# Patient Record
Sex: Female | Born: 1965 | Race: White | Hispanic: No | Marital: Married | Smoking: Never smoker
Health system: Southern US, Community
[De-identification: ages and names within clinical notes are randomized; demographics above are authoritative.]

## PROBLEM LIST (undated history)

## (undated) DIAGNOSIS — J45909 Unspecified asthma, uncomplicated: Secondary | ICD-10-CM

## (undated) DIAGNOSIS — R42 Dizziness and giddiness: Secondary | ICD-10-CM

## (undated) DIAGNOSIS — R9431 Abnormal electrocardiogram [ECG] [EKG]: Secondary | ICD-10-CM

## (undated) DIAGNOSIS — E785 Hyperlipidemia, unspecified: Secondary | ICD-10-CM

## (undated) DIAGNOSIS — F430 Acute stress reaction: Secondary | ICD-10-CM

## (undated) DIAGNOSIS — R079 Chest pain, unspecified: Secondary | ICD-10-CM

## (undated) DIAGNOSIS — H918X1 Other specified hearing loss, right ear: Secondary | ICD-10-CM

## (undated) DIAGNOSIS — F419 Anxiety disorder, unspecified: Secondary | ICD-10-CM

## (undated) HISTORY — DX: Acute stress reaction: F43.0

## (undated) HISTORY — PX: CHOLECYSTECTOMY: SHX55

## (undated) HISTORY — PX: TONSILLECTOMY: SUR1361

## (undated) HISTORY — PX: EYE SURGERY: SHX253

## (undated) HISTORY — DX: Abnormal electrocardiogram (ECG) (EKG): R94.31

## (undated) HISTORY — DX: Other specified hearing loss, right ear: H91.8X1

## (undated) HISTORY — DX: Anxiety disorder, unspecified: F41.9

## (undated) HISTORY — DX: Hyperlipidemia, unspecified: E78.5

## (undated) HISTORY — PX: ADENOIDECTOMY: SUR15

## (undated) HISTORY — PX: FOOT SURGERY: SHX648

## (undated) HISTORY — DX: Chest pain, unspecified: R07.9

---

## 1987-08-09 HISTORY — PX: APPENDECTOMY: SHX54

## 1992-08-08 HISTORY — PX: KNEE SURGERY: SHX244

## 2007-07-31 ENCOUNTER — Other Ambulatory Visit: Admission: RE | Admit: 2007-07-31 | Discharge: 2007-07-31 | Payer: Self-pay | Admitting: Obstetrics and Gynecology

## 2018-07-09 ENCOUNTER — Ambulatory Visit: Payer: BLUE CROSS/BLUE SHIELD | Admitting: Podiatry

## 2018-07-09 ENCOUNTER — Ambulatory Visit (INDEPENDENT_AMBULATORY_CARE_PROVIDER_SITE_OTHER): Payer: BLUE CROSS/BLUE SHIELD

## 2018-07-09 ENCOUNTER — Encounter: Payer: Self-pay | Admitting: Podiatry

## 2018-07-09 VITALS — Resp 16 | Ht 65.0 in | Wt 165.0 lb

## 2018-07-09 DIAGNOSIS — M7731 Calcaneal spur, right foot: Secondary | ICD-10-CM

## 2018-07-09 DIAGNOSIS — M7661 Achilles tendinitis, right leg: Principal | ICD-10-CM

## 2018-07-09 DIAGNOSIS — M79672 Pain in left foot: Secondary | ICD-10-CM

## 2018-07-09 DIAGNOSIS — M7662 Achilles tendinitis, left leg: Secondary | ICD-10-CM

## 2018-07-09 DIAGNOSIS — M79671 Pain in right foot: Secondary | ICD-10-CM

## 2018-07-09 DIAGNOSIS — M7752 Other enthesopathy of left foot: Secondary | ICD-10-CM

## 2018-07-09 DIAGNOSIS — M7732 Calcaneal spur, left foot: Secondary | ICD-10-CM

## 2018-07-09 MED ORDER — MELOXICAM 15 MG PO TABS: 15.0000 mg | ORAL_TABLET | Freq: Every day | ORAL | 0 refills | Status: DC

## 2018-07-09 MED ORDER — METHYLPREDNISOLONE 4 MG PO TBPK: ORAL_TABLET | ORAL | 0 refills | Status: DC

## 2018-07-09 NOTE — Patient Instructions (Signed)
Achilles Tendinitis  with Rehab Achilles tendinitis is a disorder of the Achilles tendon. The Achilles tendon connects the large calf muscles (Gastrocnemius and Soleus) to the heel bone (calcaneus). This tendon is sometimes called the heel cord. It is important for pushing-off and standing on your toes and is important for walking, running, or jumping. Tendinitis is often caused by overuse and repetitive microtrauma. SYMPTOMS  Pain, tenderness, swelling, warmth, and redness may occur over the Achilles tendon even at rest.  Pain with pushing off, or flexing or extending the ankle.  Pain that is worsened after or during activity. CAUSES   Overuse sometimes seen with rapid increase in exercise programs or in sports requiring running and jumping.  Poor physical conditioning (strength and flexibility or endurance).  Running sports, especially training running down hills.  Inadequate warm-up before practice or play or failure to stretch before participation.  Injury to the tendon. PREVENTION   Warm up and stretch before practice or competition.  Allow time for adequate rest and recovery between practices and competition.  Keep up conditioning.  Keep up ankle and leg flexibility.  Improve or keep muscle strength and endurance.  Improve cardiovascular fitness.  Use proper technique.  Use proper equipment (shoes, skates).  To help prevent recurrence, taping, protective strapping, or an adhesive bandage may be recommended for several weeks after healing is complete. PROGNOSIS   Recovery may take weeks to several months to heal.  Longer recovery is expected if symptoms have been prolonged.  Recovery is usually quicker if the inflammation is due to a direct blow as compared with overuse or sudden strain. RELATED COMPLICATIONS   Healing time will be prolonged if the condition is not correctly treated. The injury must be given plenty of time to heal.  Symptoms can reoccur if  activity is resumed too soon.  Untreated, tendinitis may increase the risk of tendon rupture requiring additional time for recovery and possibly surgery. TREATMENT   The first treatment consists of rest anti-inflammatory medication, and ice to relieve the pain.  Stretching and strengthening exercises after resolution of pain will likely help reduce the risk of recurrence. Referral to a physical therapist or athletic trainer for further evaluation and treatment may be helpful.  A walking boot or cast may be recommended to rest the Achilles tendon. This can help break the cycle of inflammation and microtrauma.  Arch supports (orthotics) may be prescribed or recommended by your caregiver as an adjunct to therapy and rest.  Surgery to remove the inflamed tendon lining or degenerated tendon tissue is rarely necessary and has shown less than predictable results. MEDICATION   Nonsteroidal anti-inflammatory medications, such as aspirin and ibuprofen, may be used for pain and inflammation relief. Do not take within 7 days before surgery. Take these as directed by your caregiver. Contact your caregiver immediately if any bleeding, stomach upset, or signs of allergic reaction occur. Other minor pain relievers, such as acetaminophen, may also be used.  Pain relievers may be prescribed as necessary by your caregiver. Do not take prescription pain medication for longer than 4 to 7 days. Use only as directed and only as much as you need. HEAT AND COLD  Cold is used to relieve pain and reduce inflammation for acute and chronic Achilles tendinitis. Cold should be applied for 10 to 15 minutes every 2 to 3 hours for inflammation and pain and immediately after any activity that aggravates your symptoms. Use ice packs or an ice massage.  Heat may be used   before performing stretching and strengthening activities prescribed by your caregiver. Use a heat pack or a warm soak. SEEK MEDICAL CARE IF:  Symptoms get  worse or do not improve in 2 weeks despite treatment.  New, unexplained symptoms develop. Drugs used in treatment may produce side effects.   EXERCISES-- hold each stretch for 30 seconds and repeat 10 times.  Complete each stretch 3 times per day.   RANGE OF MOTION (ROM) AND STRETCHING EXERCISES - Achilles Tendinitis  These exercises may help you when beginning to rehabilitate your injury. Your symptoms may resolve with or without further involvement from your physician, physical therapist or athletic trainer. While completing these exercises, remember:   Restoring tissue flexibility helps normal motion to return to the joints. This allows healthier, less painful movement and activity.  An effective stretch should be held for at least 30 seconds.  A stretch should never be painful. You should only feel a gentle lengthening or release in the stretched tissue. STRETCH  Gastroc, Standing   Place hands on wall.  Extend right / left leg, keeping the front knee somewhat bent.  Slightly point your toes inward on your back foot.  Keeping your right / left heel on the floor and your knee straight, shift your weight toward the wall, not allowing your back to arch.  You should feel a gentle stretch in the right / left calf. Hold this position for __________ seconds. Repeat __________ times. Complete this stretch __________ times per day. STRETCH  Soleus, Standing   Place hands on wall.  Extend right / left leg, keeping the other knee somewhat bent.  Slightly point your toes inward on your back foot.  Keep your right / left heel on the floor, bend your back knee, and slightly shift your weight over the back leg so that you feel a gentle stretch deep in your back calf.  Hold this position for __________ seconds. Repeat __________ times. Complete this stretch __________ times per day. STRETCH  Gastrocsoleus, Standing  Note: This exercise can place a lot of stress on your foot and ankle.  Please complete this exercise only if specifically instructed by your caregiver.   Place the ball of your right / left foot on a step, keeping your other foot firmly on the same step.  Hold on to the wall or a rail for balance.  Slowly lift your other foot, allowing your body weight to press your heel down over the edge of the step.  You should feel a stretch in your right / left calf.  Hold this position for __________ seconds.  Repeat this exercise with a slight bend in your knee. Repeat __________ times. Complete this stretch __________ times per day.  STRENGTHENING EXERCISES - Achilles Tendinitis These exercises may help you when beginning to rehabilitate your injury. They may resolve your symptoms with or without further involvement from your physician, physical therapist or athletic trainer. While completing these exercises, remember:   Muscles can gain both the endurance and the strength needed for everyday activities through controlled exercises.  Complete these exercises as instructed by your physician, physical therapist or athletic trainer. Progress the resistance and repetitions only as guided.  You may experience muscle soreness or fatigue, but the pain or discomfort you are trying to eliminate should never worsen during these exercises. If this pain does worsen, stop and make certain you are following the directions exactly. If the pain is still present after adjustments, discontinue the exercise until you can discuss   the trouble with your clinician. STRENGTH - Plantar-flexors   Sit with your right / left leg extended. Holding onto both ends of a rubber exercise band/tubing, loop it around the ball of your foot. Keep a slight tension in the band.  Slowly push your toes away from you, pointing them downward.  Hold this position for __________ seconds. Return slowly, controlling the tension in the band/tubing. Repeat __________ times. Complete this exercise __________ times  per day.  STRENGTH - Plantar-flexors   Stand with your feet shoulder width apart. Steady yourself with a wall or table using as little support as needed.  Keeping your weight evenly spread over the width of your feet, rise up on your toes.*  Hold this position for __________ seconds. Repeat __________ times. Complete this exercise __________ times per day.  *If this is too easy, shift your weight toward your right / left leg until you feel challenged. Ultimately, you may be asked to do this exercise with your right / left foot only. STRENGTH  Plantar-flexors, Eccentric  Note: This exercise can place a lot of stress on your foot and ankle. Please complete this exercise only if specifically instructed by your caregiver.   Place the balls of your feet on a step. With your hands, use only enough support from a wall or rail to keep your balance.  Keep your knees straight and rise up on your toes.  Slowly shift your weight entirely to your right / left toes and pick up your opposite foot. Gently and with controlled movement, lower your weight through your right / left foot so that your heel drops below the level of the step. You will feel a slight stretch in the back of your calf at the end position.  Use the healthy leg to help rise up onto the balls of both feet, then lower weight only on the right / left leg again. Build up to 15 repetitions. Then progress to 3 consecutive sets of 15 repetitions.*  After completing the above exercise, complete the same exercise with a slight knee bend (about 30 degrees). Again, build up to 15 repetitions. Then progress to 3 consecutive sets of 15 repetitions.* Perform this exercise __________ times per day.  *When you easily complete 3 sets of 15, your physician, physical therapist or athletic trainer may advise you to add resistance by wearing a backpack filled with additional weight. STRENGTH - Plantar Flexors, Seated   Sit on a chair that allows your feet  to rest flat on the ground. If necessary, sit at the edge of the chair.  Keeping your toes firmly on the ground, lift your right / left heel as far as you can without increasing any discomfort in your ankle. Repeat __________ times. Complete this exercise __________ times a day. *If instructed by your physician, physical therapist or athletic trainer, you may add ____________________ of resistance by placing a weighted object on your right / left knee. Document Released: 02/23/2005 Document Revised: 10/17/2011 Document Reviewed: 11/06/2008 [employer] Patient Information 2014 [employer].    

## 2018-07-09 NOTE — Progress Notes (Signed)
  Subjective:  Patient ID: Brenda Lewis, female    DOB: 03-Jul-1966,  MRN: [id number]  Chief Complaint  Patient presents with  . Foot Pain    BL back heel pain x several wks; 8/10 pain pt states," worst in AM or from sitting to standing. Also, I sprain my ankle all the time due to my ankles turning ." Tx: naproxen and icing   52 y.o. female presents with the above complaint. Pain to back of both ankles as above. Also complains of pain to the left great toe states she dropped something on it in August but still hurting.  Review of Systems: Negative except as noted in the HPI. Denies N/V/F/Ch.  No past medical history on file.  Current Outpatient Medications:  .  meloxicam (MOBIC) 15 MG tablet, Take 1 tablet (15 mg total) by mouth daily., Disp: 30 tablet, Rfl: 0 .  methylPREDNISolone (MEDROL DOSEPAK) 4 MG TBPK tablet, 6 Day Taper Pack. Take as Directed., Disp: 21 tablet, Rfl: 0  Social History   Tobacco Use  Smoking Status Never Smoker  Smokeless Tobacco Never Used    Not on File Objective:   Vitals:   07/09/18 1546  Resp: 16   Body mass index is 27.46 kg/m. Constitutional Well developed. Well nourished.  Vascular Dorsalis pedis pulses palpable bilaterally. Posterior tibial pulses palpable bilaterally. Capillary refill normal to all digits.  No cyanosis or clubbing noted. Pedal hair growth normal.  Neurologic Normal speech. Oriented to person, place, and time. Epicritic sensation to light touch grossly present bilaterally.  Dermatologic Nails well groomed and normal in appearance. No open wounds. No skin lesions.  Orthopedic: Normal joint ROM without pain or crepitus bilaterally. No visible deformities. Haglund deformity bilat POP posterior heel bilat POP left dorsal 1st MPJ.   Radiographs: Taken and reviewed posterior and plantar calc spurring with likely intratendinous bodies. Assessment:   1. Achilles tendonitis, bilateral   2. Heel spur, left   3. Heel spur,  right   4. Pain of left heel   5. Pain of right heel   6. Capsulitis of metatarsophalangeal (MTP) joint of left foot    Plan:  Patient was evaluated and treated and all questions answered.  Achilles Tendonitis, bilaterally -XR reviewed as above. -Educated on stretching and icing of the affected limb. -Night splint dispensed x2 -Referral placed to physical therapy. -eRx for Medrol 6-day taper. Advised on how to take medication. -eRx for Meloxicam. Advised only to take upon completion of oral steroid taper.  Capsulitis Left 1st MPJ -Offered injection, declined.  Return in about 4 weeks (around 08/06/2018).

## 2018-07-11 ENCOUNTER — Other Ambulatory Visit: Payer: Self-pay | Admitting: Podiatry

## 2018-07-11 DIAGNOSIS — M7661 Achilles tendinitis, right leg: Principal | ICD-10-CM

## 2018-07-11 DIAGNOSIS — M7752 Other enthesopathy of left foot: Secondary | ICD-10-CM

## 2018-07-11 DIAGNOSIS — M7731 Calcaneal spur, right foot: Secondary | ICD-10-CM

## 2018-07-11 DIAGNOSIS — M7732 Calcaneal spur, left foot: Secondary | ICD-10-CM

## 2018-07-11 DIAGNOSIS — M7662 Achilles tendinitis, left leg: Principal | ICD-10-CM

## 2018-07-12 ENCOUNTER — Ambulatory Visit: Payer: BLUE CROSS/BLUE SHIELD | Admitting: Podiatry

## 2018-08-06 ENCOUNTER — Ambulatory Visit: Payer: BLUE CROSS/BLUE SHIELD | Admitting: Podiatry

## 2018-08-27 ENCOUNTER — Ambulatory Visit (INDEPENDENT_AMBULATORY_CARE_PROVIDER_SITE_OTHER): Payer: BLUE CROSS/BLUE SHIELD | Admitting: Podiatry

## 2018-08-27 ENCOUNTER — Other Ambulatory Visit: Payer: Self-pay

## 2018-08-27 DIAGNOSIS — Z5329 Procedure and treatment not carried out because of patient's decision for other reasons: Secondary | ICD-10-CM

## 2018-12-18 ENCOUNTER — Other Ambulatory Visit: Payer: Self-pay

## 2018-12-18 ENCOUNTER — Ambulatory Visit: Payer: BLUE CROSS/BLUE SHIELD | Admitting: Podiatry

## 2018-12-18 ENCOUNTER — Encounter: Payer: Self-pay | Admitting: Podiatry

## 2018-12-18 VITALS — Temp 98.0°F | Resp 16

## 2018-12-18 DIAGNOSIS — M7731 Calcaneal spur, right foot: Secondary | ICD-10-CM

## 2018-12-18 DIAGNOSIS — M79671 Pain in right foot: Secondary | ICD-10-CM

## 2018-12-18 DIAGNOSIS — M7661 Achilles tendinitis, right leg: Secondary | ICD-10-CM

## 2018-12-18 DIAGNOSIS — M7662 Achilles tendinitis, left leg: Secondary | ICD-10-CM

## 2018-12-18 DIAGNOSIS — M9261 Juvenile osteochondrosis of tarsus, right ankle: Secondary | ICD-10-CM | POA: Diagnosis not present

## 2018-12-18 NOTE — Patient Instructions (Signed)
Pre-Operative Instructions  Congratulations, you have decided to take an important step towards improving your quality of life.  You can be assured that the doctors and staff at [facility] will be with you every step of the way.  Here are some important things you should know:  1. Plan to be at the surgery center/hospital at least 1 (one) hour prior to your scheduled time, unless otherwise directed by the surgical center/hospital staff.  You must have a responsible adult accompany you, remain during the surgery and drive you home.  Make sure you have directions to the surgical center/hospital to ensure you arrive on time. 2. If you are having surgery at [facility] or [facility] hospitals, you will need a copy of your medical history and physical form from your family physician within one month prior to the date of surgery. We will give you a form for your primary physician to complete.  3. We make every effort to accommodate the date you request for surgery.  However, there are times where surgery dates or times have to be moved.  We will contact you as soon as possible if a change in schedule is required.   4. No aspirin/ibuprofen for one week before surgery.  If you are on aspirin, any non-steroidal anti-inflammatory medications (Mobic, Aleve, Ibuprofen) should not be taken seven (7) days prior to your surgery.  You make take Tylenol for pain prior to surgery.  5. Medications - If you are taking daily heart and blood pressure medications, seizure, reflux, allergy, asthma, anxiety, pain or diabetes medications, make sure you notify the surgery center/hospital before the day of surgery so they can tell you which medications you should take or avoid the day of surgery. 6. No food or drink after midnight the night before surgery unless directed otherwise by surgical center/hospital staff. 7. No alcoholic beverages 24-hours prior to surgery.  No smoking 24-hours prior or 24-hours after  surgery. 8. Wear loose pants or shorts. They should be loose enough to fit over bandages, boots, and casts. 9. Don't wear slip-on shoes. Sneakers are preferred. 10. Bring your boot with you to the surgery center/hospital.  Also bring crutches or a walker if your physician has prescribed it for you.  If you do not have this equipment, it will be provided for you after surgery. 11. If you have not been contacted by the surgery center/hospital by the day before your surgery, call to confirm the date and time of your surgery. 12. Leave-time from work may vary depending on the type of surgery you have.  Appropriate arrangements should be made prior to surgery with your employer. 13. Prescriptions will be provided immediately following surgery by your doctor.  Fill these as soon as possible after surgery and take the medication as directed. Pain medications will not be refilled on weekends and must be approved by the doctor. 14. Remove nail polish on the operative foot and avoid getting pedicures prior to surgery. 15. Wash the night before surgery.  The night before surgery wash the foot and leg well with water and the antibacterial soap provided. Be sure to pay special attention to beneath the toenails and in between the toes.  Wash for at least three (3) minutes. Rinse thoroughly with water and dry well with a towel.  Perform this wash unless told not to do so by your physician.  Enclosed: 1 Ice pack (please put in freezer the night before surgery)   1 Hibiclens skin cleaner     Pre-op instructions  If you have any questions regarding the instructions, please do not hesitate to call our office.  [place]: [address], [place], [place] [address] -- [phone]  [place]: [address] [place], [place] [address] -- [phone]  [place]: [address]  [place], [place] [address] -- [phone]  [place]: [address], [address], [place], [place] [address] -- [phone]  Website:  [email or web address] 

## 2018-12-19 ENCOUNTER — Telehealth: Payer: Self-pay | Admitting: *Deleted

## 2018-12-19 NOTE — Telephone Encounter (Addendum)
"  I was told to call you to schedule my surgery."  Do you have a date that you would like to have it done?  "Yes, I'd like to do it the Wednesday before Fourth of July, July 1."  I'll get it scheduled.  "What time?" Someone from the surgical center will give you a call a day or two prior to your surgery date and that person will give you your arrival time.  You need to go online and register with the surgical via their portal.

## 2019-01-02 DIAGNOSIS — M79642 Pain in left hand: Secondary | ICD-10-CM

## 2019-01-02 DIAGNOSIS — M6580 Other synovitis and tenosynovitis, unspecified site: Secondary | ICD-10-CM

## 2019-01-02 DIAGNOSIS — M659 Synovitis and tenosynovitis, unspecified: Secondary | ICD-10-CM

## 2019-01-02 DIAGNOSIS — G5603 Carpal tunnel syndrome, bilateral upper limbs: Secondary | ICD-10-CM

## 2019-01-02 DIAGNOSIS — M25531 Pain in right wrist: Secondary | ICD-10-CM

## 2019-01-02 DIAGNOSIS — M720 Palmar fascial fibromatosis [Dupuytren]: Secondary | ICD-10-CM | POA: Insufficient documentation

## 2019-01-02 HISTORY — DX: Pain in left hand: M79.642

## 2019-01-02 HISTORY — DX: Pain in right wrist: M25.531

## 2019-01-02 HISTORY — DX: Other synovitis and tenosynovitis, unspecified site: M65.80

## 2019-01-02 HISTORY — DX: Palmar fascial fibromatosis (dupuytren): M72.0

## 2019-01-02 HISTORY — DX: Synovitis and tenosynovitis, unspecified: M65.9

## 2019-01-02 HISTORY — DX: Carpal tunnel syndrome, bilateral upper limbs: G56.03

## 2019-01-05 NOTE — Progress Notes (Signed)
  Subjective:  Patient ID: Brenda Lewis, female    DOB: [birth year],  MRN: [id number]  Chief Complaint  Patient presents with  . Tendonitis    F/U BL tendonitis Pt.s tates," foot pain is the same or worse. Pain depeds how much I do;  8-9/10 sharp pain. (L>R) Tx: PT, NS, stretching, icing and elevation -pt deneis N/V?F/Ch -pt states she completed Medrol and Meloxcam and did not help much also NS was too mch pressure on her heel   53 y.o. female presents with the above complaint. Hx above confirmed with patient.  No past medical history on file.  Current Outpatient Medications:  .  benzonatate (TESSALON) 200 MG capsule, , Disp: , Rfl:  .  estradiol (ESTRACE) 0.5 MG tablet, , Disp: , Rfl:  .  progesterone (PROMETRIUM) 200 MG capsule, , Disp: , Rfl:   Social History   Tobacco Use  Smoking Status Never Smoker  Smokeless Tobacco Never Used    Allergies  Allergen Reactions  . Contrast Media [Iodinated Diagnostic Agents]   . Codeine Other (See Comments)   Objective:   Vitals:   12/18/18 1629  Resp: 16  Temp: 98 F (36.7 C)   There is no height or weight on file to calculate BMI. Constitutional Well developed. Well nourished.  Vascular Dorsalis pedis pulses palpable bilaterally. Posterior tibial pulses palpable bilaterally. Capillary refill normal to all digits.  No cyanosis or clubbing noted. Pedal hair growth normal.  Neurologic Normal speech. Oriented to person, place, and time. Epicritic sensation to light touch grossly present bilaterally.  Dermatologic Nails well groomed and normal in appearance. No open wounds. No skin lesions.  Orthopedic: Normal joint ROM without pain or crepitus bilaterally. No visible deformities. Haglund deformity bilat POP posterior heel bilat POP left dorsal 1st MPJ.   Radiographs: None Assessment:   1. Achilles tendonitis, bilateral   2. Heel spur, right   3. Pain of right heel   4. Haglund's deformity of right heel    Plan:   Patient was evaluated and treated and all questions answered.  Achilles Tendonitis, bilaterally -Patient has failed all conservative therapy and wishes to proceed with surgical intervention. All risks, benefits, and alternatives discussed with patient. No guarantees given. Consent reviewed and signed by patient. -Planned procedures: Gastrocnemius recession right, calcaneal ostectomy with detachment and reattachment of Achilles tendon. -Patient verbalized understanding that she will be nonweightbearing post procedure. -We will discussed with patient whether we need to prophylax for DVT  27 minutes of face to face time were spent with the patient. >50% of this was spent on counseling and coordination of care. Specifically discussed with patient the above diagnoses and overall treatment plan.   No follow-ups on file.

## 2019-01-29 ENCOUNTER — Other Ambulatory Visit: Payer: Self-pay | Admitting: *Deleted

## 2019-01-29 DIAGNOSIS — M7662 Achilles tendinitis, left leg: Secondary | ICD-10-CM

## 2019-01-29 DIAGNOSIS — M79671 Pain in right foot: Secondary | ICD-10-CM

## 2019-01-29 DIAGNOSIS — M7661 Achilles tendinitis, right leg: Secondary | ICD-10-CM

## 2019-01-29 DIAGNOSIS — M7731 Calcaneal spur, right foot: Secondary | ICD-10-CM

## 2019-01-29 DIAGNOSIS — M9261 Juvenile osteochondrosis of tarsus, right ankle: Secondary | ICD-10-CM

## 2019-01-29 NOTE — Progress Notes (Signed)
DME order for knee scooter for postoperative use

## 2019-01-31 ENCOUNTER — Telehealth: Payer: Self-pay | Admitting: *Deleted

## 2019-01-31 NOTE — Telephone Encounter (Signed)
DOS 02/06/2019; 91694 - CALCANEAL OSTECTOMY AND 50388 - GASTROCNEMIUS RECESSION RIGHT FOOT  BCBS: Effective Date - 07/11/2016 - 08/07/9998   In-Network    Max Per Benefit Period Year-to-Date Remaining  CoInsurance  $1,500.00 $896.70  Deductible  $300.00 $155.64  Out-Of-Pocket  $1,500.00 $896.70   In-Network Individual  [employer] Coinsurance Authorization Required  Not Applicable  82%  Yes   PRE-CERTIFICATION IS NOT REQUIRED PER [employer] - Call Ref. # [id number]

## 2019-02-06 ENCOUNTER — Encounter: Payer: Self-pay | Admitting: Podiatry

## 2019-02-06 ENCOUNTER — Other Ambulatory Visit: Payer: Self-pay | Admitting: Podiatry

## 2019-02-06 DIAGNOSIS — M7661 Achilles tendinitis, right leg: Secondary | ICD-10-CM

## 2019-02-06 DIAGNOSIS — M216X1 Other acquired deformities of right foot: Secondary | ICD-10-CM

## 2019-02-06 DIAGNOSIS — M2021 Hallux rigidus, right foot: Secondary | ICD-10-CM

## 2019-02-06 MED ORDER — OXYCODONE-ACETAMINOPHEN 10-325 MG PO TABS: 1.0000 | ORAL_TABLET | ORAL | 0 refills | Status: DC | PRN

## 2019-02-06 MED ORDER — CEPHALEXIN 500 MG PO CAPS: 500.0000 mg | ORAL_CAPSULE | Freq: Two times a day (BID) | ORAL | 0 refills | Status: DC

## 2019-02-06 NOTE — Progress Notes (Signed)
Rx sent to pharmacy for outpatient surgery. °

## 2019-02-07 ENCOUNTER — Telehealth: Payer: Self-pay

## 2019-02-07 NOTE — Telephone Encounter (Signed)
POST OP CALL-    1) General condition stated by the patient: Pt doing well  2) Is the pt having pain? None, block still working.  3) Pain score: None, but has some swelling.  4) Has the pt taken Rx'd pain medication, regularly or PRN? Not yet, still numb  5) Is the pain medication giving relief?  6) Any fever, chills, nausea, or vomiting, shortness of breath or tightness in calf? None  7) Is the bandage clean, dry and intact? Yes  8) Is there excessive tightness, bleeding or drainage coming through the bandage? No  9) Did you understand all of the post op instruction sheet given?  10) Any questions or concerns regarding post op care/recovery? Pt asked if Benadryl was okay to take with Oxycodone. Pt was informed that she can do this.    Confirmed POV appointment with patient

## 2019-02-12 ENCOUNTER — Other Ambulatory Visit: Payer: Self-pay

## 2019-02-12 ENCOUNTER — Ambulatory Visit (INDEPENDENT_AMBULATORY_CARE_PROVIDER_SITE_OTHER): Payer: BC Managed Care – PPO | Admitting: Podiatry

## 2019-02-12 ENCOUNTER — Encounter: Payer: Self-pay | Admitting: Podiatry

## 2019-02-12 ENCOUNTER — Other Ambulatory Visit: Payer: Self-pay | Admitting: Podiatry

## 2019-02-12 ENCOUNTER — Ambulatory Visit (INDEPENDENT_AMBULATORY_CARE_PROVIDER_SITE_OTHER): Payer: BC Managed Care – PPO

## 2019-02-12 VITALS — Temp 96.1°F | Resp 16

## 2019-02-12 DIAGNOSIS — M9261 Juvenile osteochondrosis of tarsus, right ankle: Secondary | ICD-10-CM | POA: Diagnosis not present

## 2019-02-12 DIAGNOSIS — Z9889 Other specified postprocedural states: Secondary | ICD-10-CM

## 2019-02-12 DIAGNOSIS — M7661 Achilles tendinitis, right leg: Secondary | ICD-10-CM

## 2019-02-12 DIAGNOSIS — M779 Enthesopathy, unspecified: Secondary | ICD-10-CM

## 2019-02-12 DIAGNOSIS — M7662 Achilles tendinitis, left leg: Secondary | ICD-10-CM | POA: Diagnosis not present

## 2019-02-19 ENCOUNTER — Encounter: Payer: BLUE CROSS/BLUE SHIELD | Admitting: Podiatry

## 2019-02-20 ENCOUNTER — Telehealth: Payer: Self-pay | Admitting: *Deleted

## 2019-02-20 NOTE — Telephone Encounter (Signed)
Patient called stated that she fell off her knee scooter putting her weight on her casted surgery foot Tuesday afternoon around 4pm.  States she has been having more pain than before.  Spoke with Dr Cannon Kettle and went over areas of concern  Swelling was worse last night causing cast to be slightly tighter but has gone down since.  Pain is being relieved with current pain meds.  No redness, warmth or streaking where visible around cast.  Per Dr Cannon Kettle patient is to rest with elevation as much as possible staying on top of pain meds and icing.  If swelling increases to where cast is tight and or painful she notices redness warmth or streaking she is to call the office to schedule an appointment or proceed to the ER if after hours.  Patient stated understanding.

## 2019-02-20 NOTE — Telephone Encounter (Signed)
Noted thank

## 2019-02-25 ENCOUNTER — Other Ambulatory Visit: Payer: Self-pay | Admitting: Podiatry

## 2019-02-25 DIAGNOSIS — Z9889 Other specified postprocedural states: Secondary | ICD-10-CM

## 2019-02-25 DIAGNOSIS — M779 Enthesopathy, unspecified: Secondary | ICD-10-CM

## 2019-02-26 ENCOUNTER — Ambulatory Visit (INDEPENDENT_AMBULATORY_CARE_PROVIDER_SITE_OTHER): Payer: Self-pay | Admitting: Sports Medicine

## 2019-02-26 ENCOUNTER — Other Ambulatory Visit: Payer: Self-pay

## 2019-02-26 ENCOUNTER — Encounter: Payer: Self-pay | Admitting: Sports Medicine

## 2019-02-26 ENCOUNTER — Encounter: Payer: BC Managed Care – PPO | Admitting: Podiatry

## 2019-02-26 VITALS — Temp 98.0°F

## 2019-02-26 DIAGNOSIS — M9261 Juvenile osteochondrosis of tarsus, right ankle: Secondary | ICD-10-CM

## 2019-02-26 DIAGNOSIS — M7731 Calcaneal spur, right foot: Secondary | ICD-10-CM

## 2019-02-26 DIAGNOSIS — M7661 Achilles tendinitis, right leg: Secondary | ICD-10-CM

## 2019-02-26 DIAGNOSIS — M79671 Pain in right foot: Secondary | ICD-10-CM

## 2019-02-26 DIAGNOSIS — M7662 Achilles tendinitis, left leg: Secondary | ICD-10-CM

## 2019-02-26 NOTE — Progress Notes (Signed)
Subjective: Brenda Lewis is a 53 y.o. female patient seen today in office for POV #1 (DOS 02-06-19), S/P Right achilles tendon lenthening, heel spur reduction with Dr. March Rummage . Patient admits some pain at the lateral edge of the cast all the time, denies calf pain, denies headache, chest pain, shortness of breath, nausea, vomiting, fever, or chills. No other issues noted.   There are no active problems to display for this patient.   Current Outpatient Medications on File Prior to Visit  Medication Sig Dispense Refill  . albuterol (VENTOLIN HFA) 108 (90 Base) MCG/ACT inhaler albuterol sulfate HFA 90 mcg/actuation aerosol inhaler    . ALPRAZolam (XANAX) 0.25 MG tablet TAKE 1 TAB(S) ORALLY 2 TIMES A DAY FOR 7 DAY(S) AS NEEDED    . benzonatate (TESSALON) 200 MG capsule     . cephALEXin (KEFLEX) 500 MG capsule Take 1 capsule (500 mg total) by mouth 2 (two) times daily. 14 capsule 0  . estradiol (ESTRACE) 0.5 MG tablet     . oxyCODONE-acetaminophen (PERCOCET) 10-325 MG tablet Take 1 tablet by mouth every 4 (four) hours as needed for pain. 20 tablet 0  . progesterone (PROMETRIUM) 200 MG capsule      No current facility-administered medications on file prior to visit.     Allergies  Allergen Reactions  . Gadolinium Derivatives Anaphylaxis and Shortness Of Breath  . Contrast Media [Iodinated Diagnostic Agents]   . Codeine Other (See Comments)    Objective: There were no vitals filed for this visit.  General: No acute distress, AAOx3  Right foot: Staples intact with minimal gapping or dehiscence at surgical site, mild swelling to right foot, no erythema, no warmth, no drainage, no signs of infection noted, Capillary fill time <3 seconds in all digits, gross sensation present via light touch to right foot.  No pain with calf compression.   Assessment and Plan:  Problem List Items Addressed This Visit    None    Visit Diagnoses    Haglund's deformity of right heel    -  Primary   Achilles  tendonitis, bilateral       Heel spur, right       Pain of right heel          -Patient seen and evaluated -Cast was removed and few staples were removed and dry dressing applied -Dispensed CAM boot (patient will bring her other boot that she has at home to return it to [place] since she has a boot already) -Advised patient to continue with NWB with use of scooter -Continue with rest, ice, elevation, and Motrin PRN to assist with pain control. Advised patient that tingling can be normal post op and that we will continue to monitor to see how it improves with time -Will plan for finishing staple removal at next office visit with Dr. March Rummage. In the meantime, patient to call office if any issues or problems arise.   Landis Martins, DPM

## 2019-03-02 DIAGNOSIS — M189 Osteoarthritis of first carpometacarpal joint, unspecified: Secondary | ICD-10-CM

## 2019-03-02 HISTORY — DX: Osteoarthritis of first carpometacarpal joint, unspecified: M18.9

## 2019-03-03 NOTE — Progress Notes (Signed)
  Subjective:  Patient ID: Brenda Lewis, female    DOB: 1966/05/16,  MRN: [id number]  Chief Complaint  Patient presents with  . Routine Post Op    POV#1 DOS 02/06/2019 CALCANEAL OSTECTOMY AND GASTROCNEMIUS RECESS RT -pt states," pain depends on what I do; 1-2/10 discomofrt and w/o IBU of oxy 4/10 discomfort." Tx: boot, IBU, oxy , scooter, elevation and eicing -pt denies N/V/?FH     DOS: 02/06/2019 Procedure: Gastrocnemius recession right, Haglund resection with detachment reattachment of Achilles tendon  53 y.o. female returns for post-op check.  History as above.  Review of Systems: Negative except as noted in the HPI. Denies N/V/F/Ch.  No past medical history on file.  Current Outpatient Medications:  .  albuterol (VENTOLIN HFA) 108 (90 Base) MCG/ACT inhaler, albuterol sulfate HFA 90 mcg/actuation aerosol inhaler, Disp: , Rfl:  .  ALPRAZolam (XANAX) 0.25 MG tablet, TAKE 1 TAB(S) ORALLY 2 TIMES A DAY FOR 7 DAY(S) AS NEEDED, Disp: , Rfl:  .  benzonatate (TESSALON) 200 MG capsule, , Disp: , Rfl:  .  cephALEXin (KEFLEX) 500 MG capsule, Take 1 capsule (500 mg total) by mouth 2 (two) times daily., Disp: 14 capsule, Rfl: 0 .  estradiol (ESTRACE) 0.5 MG tablet, , Disp: , Rfl:  .  oxyCODONE-acetaminophen (PERCOCET) 10-325 MG tablet, Take 1 tablet by mouth every 4 (four) hours as needed for pain., Disp: 20 tablet, Rfl: 0 .  progesterone (PROMETRIUM) 200 MG capsule, , Disp: , Rfl:   Social History   Tobacco Use  Smoking Status Never Smoker  Smokeless Tobacco Never Used    Allergies  Allergen Reactions  . Gadolinium Derivatives Anaphylaxis and Shortness Of Breath  . Contrast Media [Iodinated Diagnostic Agents]   . Codeine Other (See Comments)   Objective:   Vitals:   02/12/19 1059  Resp: 16  Temp: (!) 96.1 F (35.6 C)   There is no height or weight on file to calculate BMI. Constitutional Well developed. Well nourished.  Vascular Foot warm and well perfused. Capillary refill  normal to all digits.   Neurologic Normal speech. Oriented to person, place, and time. Epicritic sensation to light touch grossly present bilaterally.  Dermatologic Skin healing well without signs of infection. Skin edges well coapted without signs of infection.  Orthopedic: Tenderness to palpation noted about the surgical site.   Radiographs: None Assessment:   1. Haglund's deformity of right heel   2. Achilles tendonitis, bilateral   3. Post-operative state    Plan:  Patient was evaluated and treated and all questions answered.  S/p foot surgery right -Progressing as expected post-operatively. -XR: None -WB Status: Nonweightbearing in knee scooter -Sutures: Intact. -Medications: None refilled -Foot redressed.  Short leg cast applied with 3 rolls of fiberglass cast material  No follow-ups on file.

## 2019-03-05 ENCOUNTER — Ambulatory Visit (INDEPENDENT_AMBULATORY_CARE_PROVIDER_SITE_OTHER): Payer: Self-pay | Admitting: Podiatry

## 2019-03-05 ENCOUNTER — Other Ambulatory Visit: Payer: Self-pay

## 2019-03-05 ENCOUNTER — Encounter: Payer: Self-pay | Admitting: Podiatry

## 2019-03-05 VITALS — Temp 100.0°F | Resp 16

## 2019-03-05 DIAGNOSIS — M7731 Calcaneal spur, right foot: Secondary | ICD-10-CM

## 2019-03-05 DIAGNOSIS — M79671 Pain in right foot: Secondary | ICD-10-CM

## 2019-03-05 DIAGNOSIS — M9261 Juvenile osteochondrosis of tarsus, right ankle: Secondary | ICD-10-CM

## 2019-03-05 DIAGNOSIS — M7661 Achilles tendinitis, right leg: Secondary | ICD-10-CM

## 2019-03-05 DIAGNOSIS — M7662 Achilles tendinitis, left leg: Secondary | ICD-10-CM

## 2019-03-05 NOTE — Progress Notes (Signed)
  Subjective:  Patient ID: Brenda Lewis, female    DOB: Mar 08, 1966,  MRN: [id number]  Chief Complaint  Patient presents with  . Routine Post Op    Pt. states," yesterday los my blaqnce and fell but I didn't hit my foot. Overall, it's been doin good. Still with a numb place at my ankle." tx: boot, scooter, rest, icing, and elevation and IBu     DOS: 02/06/2019 Procedure: Gastrocnemius recession right, Haglund resection with detachment reattachment of Achilles tendon  53 y.o. female returns for post-op check.  History as above.  Review of Systems: Negative except as noted in the HPI. Denies N/V/F/Ch.  No past medical history on file.  Current Outpatient Medications:  .  albuterol (VENTOLIN HFA) 108 (90 Base) MCG/ACT inhaler, albuterol sulfate HFA 90 mcg/actuation aerosol inhaler, Disp: , Rfl:  .  ALPRAZolam (XANAX) 0.25 MG tablet, TAKE 1 TAB(S) ORALLY 2 TIMES A DAY FOR 7 DAY(S) AS NEEDED, Disp: , Rfl:  .  benzonatate (TESSALON) 200 MG capsule, , Disp: , Rfl:  .  cephALEXin (KEFLEX) 500 MG capsule, Take 1 capsule (500 mg total) by mouth 2 (two) times daily., Disp: 14 capsule, Rfl: 0 .  estradiol (ESTRACE) 0.5 MG tablet, , Disp: , Rfl:  .  oxyCODONE-acetaminophen (PERCOCET) 10-325 MG tablet, Take 1 tablet by mouth every 4 (four) hours as needed for pain., Disp: 20 tablet, Rfl: 0 .  progesterone (PROMETRIUM) 200 MG capsule, , Disp: , Rfl:   Social History   Tobacco Use  Smoking Status Never Smoker  Smokeless Tobacco Never Used    Allergies  Allergen Reactions  . Gadolinium Derivatives Anaphylaxis, Shortness Of Breath and Hives  . Contrast Media [Iodinated Diagnostic Agents]   . Codeine Other (See Comments)   Objective:   Vitals:   03/05/19 1630  Resp: 16  Temp: 100 F (37.8 C)   There is no height or weight on file to calculate BMI. Constitutional Well developed. Well nourished.  Vascular Foot warm and well perfused. Capillary refill normal to all digits.   Neurologic  Normal speech. Oriented to person, place, and time. Epicritic sensation to light touch grossly present bilaterally. Slight area of hypoesthesia lateral to the incision but sural nerve distribution intact.  Dermatologic Skin healing well without signs of infection. Skin edges well coapted without signs of infection.  Orthopedic: Tenderness to palpation noted about the surgical site.   Radiographs: None Assessment:   1. Haglund's deformity of right heel   2. Achilles tendonitis, bilateral   3. Heel spur, right   4. Pain of right heel    Plan:  Patient was evaluated and treated and all questions answered.  S/p foot surgery right -Progressing as expected post-operatively. -XR: None -WB Status: Nonweightbearing in knee scooter -Sutures: Staples left intact -Refer to PT. Start PROM x1 week. Then AROM x 1 week. No WB. Plan to transition WB in 2 weeks. -Medications: None refilled  No follow-ups on file. Will also fill out forms for to plan her left foot surgery at next visit.

## 2019-03-08 ENCOUNTER — Telehealth: Payer: Self-pay | Admitting: Podiatry

## 2019-03-08 NOTE — Telephone Encounter (Signed)
[facility] called stating the patient had reached out to them wanting to get scheduled for an appt but their office has not received a referral from [place].   Please fax referral to their office.  Pt is schedule for Tuesday, 8/4 @ 10am   Fax# [phone] Phone# [phone]

## 2019-03-19 ENCOUNTER — Ambulatory Visit (INDEPENDENT_AMBULATORY_CARE_PROVIDER_SITE_OTHER): Payer: BC Managed Care – PPO | Admitting: Podiatry

## 2019-03-19 ENCOUNTER — Other Ambulatory Visit: Payer: Self-pay

## 2019-03-19 ENCOUNTER — Telehealth: Payer: Self-pay | Admitting: *Deleted

## 2019-03-19 DIAGNOSIS — M21862 Other specified acquired deformities of left lower leg: Secondary | ICD-10-CM

## 2019-03-19 DIAGNOSIS — M9262 Juvenile osteochondrosis of tarsus, left ankle: Secondary | ICD-10-CM

## 2019-03-19 DIAGNOSIS — M7732 Calcaneal spur, left foot: Secondary | ICD-10-CM

## 2019-03-19 DIAGNOSIS — M79672 Pain in left foot: Secondary | ICD-10-CM

## 2019-03-19 DIAGNOSIS — M7752 Other enthesopathy of left foot: Secondary | ICD-10-CM

## 2019-03-19 NOTE — Patient Instructions (Signed)
Pre-Operative Instructions  Congratulations, you have decided to take an important step towards improving your quality of life.  You can be assured that the doctors and staff at [facility] will be with you every step of the way.  Here are some important things you should know:  1. Plan to be at the surgery center/hospital at least 1 (one) hour prior to your scheduled time, unless otherwise directed by the surgical center/hospital staff.  You must have a responsible adult accompany you, remain during the surgery and drive you home.  Make sure you have directions to the surgical center/hospital to ensure you arrive on time. 2. If you are having surgery at [facility] or [facility] hospitals, you will need a copy of your medical history and physical form from your family physician within one month prior to the date of surgery. We will give you a form for your primary physician to complete.  3. We make every effort to accommodate the date you request for surgery.  However, there are times where surgery dates or times have to be moved.  We will contact you as soon as possible if a change in schedule is required.   4. No aspirin/ibuprofen for one week before surgery.  If you are on aspirin, any non-steroidal anti-inflammatory medications (Mobic, Aleve, Ibuprofen) should not be taken seven (7) days prior to your surgery.  You make take Tylenol for pain prior to surgery.  5. Medications - If you are taking daily heart and blood pressure medications, seizure, reflux, allergy, asthma, anxiety, pain or diabetes medications, make sure you notify the surgery center/hospital before the day of surgery so they can tell you which medications you should take or avoid the day of surgery. 6. No food or drink after midnight the night before surgery unless directed otherwise by surgical center/hospital staff. 7. No alcoholic beverages 24-hours prior to surgery.  No smoking 24-hours prior or 24-hours after  surgery. 8. Wear loose pants or shorts. They should be loose enough to fit over bandages, boots, and casts. 9. Don't wear slip-on shoes. Sneakers are preferred. 10. Bring your boot with you to the surgery center/hospital.  Also bring crutches or a walker if your physician has prescribed it for you.  If you do not have this equipment, it will be provided for you after surgery. 11. If you have not been contacted by the surgery center/hospital by the day before your surgery, call to confirm the date and time of your surgery. 12. Leave-time from work may vary depending on the type of surgery you have.  Appropriate arrangements should be made prior to surgery with your employer. 13. Prescriptions will be provided immediately following surgery by your doctor.  Fill these as soon as possible after surgery and take the medication as directed. Pain medications will not be refilled on weekends and must be approved by the doctor. 14. Remove nail polish on the operative foot and avoid getting pedicures prior to surgery. 15. Wash the night before surgery.  The night before surgery wash the foot and leg well with water and the antibacterial soap provided. Be sure to pay special attention to beneath the toenails and in between the toes.  Wash for at least three (3) minutes. Rinse thoroughly with water and dry well with a towel.  Perform this wash unless told not to do so by your physician.  Enclosed: 1 Ice pack (please put in freezer the night before surgery)   1 Hibiclens skin cleaner     Pre-op instructions  If you have any questions regarding the instructions, please do not hesitate to call our office.  [place]: [address], [place], [place] [address] -- [phone]  [place]: [address] [place], [place] [address] -- [phone]  [place]: [address]  , [place] [address] -- [phone]  [place]: [address], [address], [place],  [address] -- [phone]  Website:  [email or web address] 

## 2019-03-19 NOTE — Telephone Encounter (Signed)
"  I'm calling to schedule my surgery with Dr. Amalia Hailey."  Have you signed your consent forms?  "Yes, I signed them this morning."  I don't have your paperwork at this time.  I will call you back to schedule it when I get the needed information.  "Okay, that is fine."   (Please send the paperwork.)

## 2019-03-19 NOTE — Telephone Encounter (Signed)
This patient scheduled surgery this morning with Dr March Rummage.

## 2019-03-19 NOTE — Progress Notes (Signed)
  Subjective:  Patient ID: Brenda Lewis, female    DOB: [birth year],  MRN: [id number]  No chief complaint on file.  DOS: 02/06/2019 Procedure: Gastrocnemius recession right, Haglund resection with detachment reattachment of Achilles tendon  53 y.o. female returns for post-op check.  States she is doing well on the right and has no concerns.   Having pain on her left and would like to schedule surgery. Having pain in the big toe joint and in the heel spur area.  Review of Systems: Negative except as noted in the HPI. Denies N/V/F/Ch.  No past medical history on file.  Current Outpatient Medications:  .  albuterol (VENTOLIN HFA) 108 (90 Base) MCG/ACT inhaler, albuterol sulfate HFA 90 mcg/actuation aerosol inhaler, Disp: , Rfl:  .  ALPRAZolam (XANAX) 0.25 MG tablet, TAKE 1 TAB(S) ORALLY 2 TIMES A DAY FOR 7 DAY(S) AS NEEDED, Disp: , Rfl:  .  benzonatate (TESSALON) 200 MG capsule, , Disp: , Rfl:  .  cephALEXin (KEFLEX) 500 MG capsule, Take 1 capsule (500 mg total) by mouth 2 (two) times daily., Disp: 14 capsule, Rfl: 0 .  estradiol (ESTRACE) 0.5 MG tablet, , Disp: , Rfl:  .  oxyCODONE-acetaminophen (PERCOCET) 10-325 MG tablet, Take 1 tablet by mouth every 4 (four) hours as needed for pain., Disp: 20 tablet, Rfl: 0 .  progesterone (PROMETRIUM) 200 MG capsule, , Disp: , Rfl:   Social History   Tobacco Use  Smoking Status Never Smoker  Smokeless Tobacco Never Used    Allergies  Allergen Reactions  . Gadolinium Derivatives Anaphylaxis, Shortness Of Breath and Hives  . Contrast Media [Iodinated Diagnostic Agents]   . Codeine Other (See Comments)   Objective:   There were no vitals filed for this visit. There is no height or weight on file to calculate BMI. Constitutional Well developed. Well nourished.  Vascular Foot warm and well perfused. Capillary refill normal to all digits.   Neurologic Normal speech. Oriented to person, place, and time. Epicritic sensation to light touch  grossly present bilaterally. Slight area of hypoesthesia lateral to the incision but sural nerve distribution intact.  Dermatologic Skin healing with small area of scab distally.  Orthopedic: Mild to palpation noted about the surgical site.  POP L 1st MPJ dorsally. Slight prominent medial eminence Decrease ankle ROM left. Prominent posterior calcaneal exostosis.   Radiographs: None Assessment:   1. Haglund's deformity, left   2. Heel spur, left   3. Acquired posterior equinus, left   4. Pain of left heel   5. Capsulitis of metatarsophalangeal (MTP) joint of left foot    Plan:  Patient was evaluated and treated and all questions answered.  S/p foot surgery right -Progressing as expected post-operatively. -XR: None -WB Status: Transition to WB with PT.  -Sutures: Staples removed. -Continue PT. -Medications: None refilled  Haglund Deformity, Equinus, Capsulitis, Hallux Limitus Left -Discussed plan to proceed with her left foot. -Patient has failed all conservative therapy and wishes to proceed with surgical intervention. All risks, benefits, and alternatives discussed with patient. No guarantees given. Consent reviewed and signed by patient. -Planned procedures: Cheilectomy vs Austin/Youngswick bunionectomy left; Haglund's resection with detachment/reattachment of Achilles tendon, Gastroc recession.  No follow-ups on file.

## 2019-03-20 NOTE — Telephone Encounter (Signed)
I'm returning your call.  You want to schedule your surgery?  "Yes, I do.  I would like to schedule it for October 7.  Is he going to be on vacation at that time?  He wasn't sure if that was the date or not."  Yes, he can do your surgery on May 15, 2019.  I'll get it scheduled.  "I need to go online and register right?  How do I go about doing that?"  Yes, you need to register with the surgery center via their online portal.  The instructions are in the brochure that we gave you.

## 2019-04-02 ENCOUNTER — Other Ambulatory Visit: Payer: Self-pay

## 2019-04-02 ENCOUNTER — Ambulatory Visit (INDEPENDENT_AMBULATORY_CARE_PROVIDER_SITE_OTHER): Payer: BC Managed Care – PPO | Admitting: Podiatry

## 2019-04-02 VITALS — Temp 98.8°F | Resp 16

## 2019-04-02 DIAGNOSIS — Z9889 Other specified postprocedural states: Secondary | ICD-10-CM | POA: Diagnosis not present

## 2019-04-02 DIAGNOSIS — M9261 Juvenile osteochondrosis of tarsus, right ankle: Secondary | ICD-10-CM

## 2019-04-02 NOTE — Progress Notes (Signed)
  Subjective:  Patient ID: Brenda Lewis, female    DOB: 07/11/1966,  MRN: [id number]  Chief Complaint  Patient presents with  . Routine Post Op    OS 02/06/2019 CALCANEAL OSTECTOMY AND GASTROCNEMIUS RECESS RT -Pt. states," it's doing good, little pain in the heel but nothing fromn the sx. Got all my ROM back; 3/10 sorness at heel." tx: IBU, icing, PT, and eelvation -w/ swelling -pt denies N/V/F/Ch  -pt states her sx date for Lt foot is for OCt-02-2019    DOS: 02/06/2019 Procedure: Gastrocnemius recession right, Haglund resection with detachment reattachment of Achilles tendon  53 y.o. female returns for post-op check. Hx as above.  Review of Systems: Negative except as noted in the HPI. Denies N/V/F/Ch.  No past medical history on file.  Current Outpatient Medications:  .  albuterol (VENTOLIN HFA) 108 (90 Base) MCG/ACT inhaler, albuterol sulfate HFA 90 mcg/actuation aerosol inhaler, Disp: , Rfl:  .  ALPRAZolam (XANAX) 0.25 MG tablet, TAKE 1 TAB(S) ORALLY 2 TIMES A DAY FOR 7 DAY(S) AS NEEDED, Disp: , Rfl:  .  estradiol (ESTRACE) 0.5 MG tablet, , Disp: , Rfl:  .  progesterone (PROMETRIUM) 200 MG capsule, , Disp: , Rfl:   Social History   Tobacco Use  Smoking Status Never Smoker  Smokeless Tobacco Never Used    Allergies  Allergen Reactions  . Gadolinium Derivatives Anaphylaxis, Shortness Of Breath and Hives  . Contrast Media [Iodinated Diagnostic Agents]   . Codeine Other (See Comments)   Objective:   Vitals:   04/02/19 0923  Resp: 16  Temp: 98.8 F (37.1 C)   There is no height or weight on file to calculate BMI. Constitutional Well developed. Well nourished.  Vascular Foot warm and well perfused. Capillary refill normal to all digits.   Neurologic Normal speech. Oriented to person, place, and time. Epicritic sensation to light touch grossly present bilaterally. Slight area of hypoesthesia lateral to the incision but sural nerve distribution intact.  Dermatologic Skin  healing with small area of scab distally.  Orthopedic: No pain to palpation noted about the surgical site. Good Achilles strength right, no dell.  POP L 1st MPJ dorsally. Slight prominent medial eminence Decrease ankle ROM left. Prominent posterior calcaneal exostosis.   Radiographs: None Assessment:   1. Haglund's deformity of right heel   2. Post-operative state    Plan:  Patient was evaluated and treated and all questions answered.  S/p foot surgery right -Progressing as expected post-operatively. -XR: None -WB Status: Continue WBAT. Transition to surgical shoe. Shoe dispensed..  -Continue PT. -Medications: None refilled  Haglund Deformity, Equinus, Capsulitis, Hallux Limitus Left -Pending surgery. -Planned procedures: Cheilectomy vs Austin/Young[place] bunionectomy left; Haglund's resection with detachment/reattachment of Achilles tendon, Gastroc recession.  No follow-ups on file.

## 2019-04-23 ENCOUNTER — Ambulatory Visit (INDEPENDENT_AMBULATORY_CARE_PROVIDER_SITE_OTHER): Payer: Self-pay | Admitting: Podiatry

## 2019-04-23 ENCOUNTER — Other Ambulatory Visit: Payer: Self-pay

## 2019-04-23 DIAGNOSIS — M9261 Juvenile osteochondrosis of tarsus, right ankle: Secondary | ICD-10-CM

## 2019-04-23 DIAGNOSIS — Z9889 Other specified postprocedural states: Secondary | ICD-10-CM

## 2019-04-29 NOTE — Progress Notes (Signed)
  Subjective:  Patient ID: Brenda Lewis, female    DOB: 08-11-1965,  MRN: [id number]  Chief Complaint  Patient presents with  . Routine Post Op    3 wks DOS 02/06/2019 CALCANEAL OSTECTOMY AND GASTROCNEMIUS RECESS RT   DOS: 02/06/2019 Procedure: Gastrocnemius recession right, Haglund resection with detachment reattachment of Achilles tendon  53 y.o. female returns for post-op check. Hx as above.  Review of Systems: Negative except as noted in the HPI. Denies N/V/F/Ch.  No past medical history on file.  Current Outpatient Medications:  .  albuterol (VENTOLIN HFA) 108 (90 Base) MCG/ACT inhaler, albuterol sulfate HFA 90 mcg/actuation aerosol inhaler, Disp: , Rfl:  .  ALPRAZolam (XANAX) 0.25 MG tablet, TAKE 1 TAB(S) ORALLY 2 TIMES A DAY FOR 7 DAY(S) AS NEEDED, Disp: , Rfl:  .  estradiol (ESTRACE) 0.5 MG tablet, , Disp: , Rfl:  .  progesterone (PROMETRIUM) 200 MG capsule, , Disp: , Rfl:   Social History   Tobacco Use  Smoking Status Never Smoker  Smokeless Tobacco Never Used    Allergies  Allergen Reactions  . Gadolinium Derivatives Anaphylaxis, Shortness Of Breath and Hives  . Contrast Media [Iodinated Diagnostic Agents]   . Codeine Other (See Comments)   Objective:   There were no vitals filed for this visit. There is no height or weight on file to calculate BMI. Constitutional Well developed. Well nourished.  Vascular Foot warm and well perfused. Capillary refill normal to all digits.   Neurologic Normal speech. Oriented to person, place, and time. Epicritic sensation to light touch grossly present bilaterally. Slight area of hypoesthesia lateral to the incision but sural nerve distribution intact.  Dermatologic Skin well-healed right  Orthopedic: No pain to palpation noted about the surgical site. Good Achilles strength right, no dell.   POP L 1st MPJ dorsally. Slight prominent medial eminence Decrease ankle ROM left. Prominent posterior calcaneal exostosis.    Radiographs: None Assessment:   1. Haglund's deformity of right heel   2. Post-operative state    Plan:  Patient was evaluated and treated and all questions answered.  S/p foot surgery right -Doing very well transition to normal shoe gear.  Skin well-healed  Haglund Deformity, Equinus, Capsulitis, Hallux Limitus Left -Pending surgery.  Plan to proceed in the coming weeks with left foot surgery -Planned procedures: Cheilectomy vs Austin/Youngswick bunionectomy left; Haglund's resection with detachment/reattachment of Achilles tendon, Gastroc recession.  No follow-ups on file.

## 2019-05-01 ENCOUNTER — Telehealth: Payer: Self-pay | Admitting: *Deleted

## 2019-05-01 NOTE — Telephone Encounter (Signed)
"  Brenda Lewis will need a Covid test done before her surgery date.  We called her to schedule it and she said she's going to be out of town.  So, you may want to call her and reschedule her surgery."  I'll give her a call.   I was informed by the scheduler at the surgical center that I need to reschedule your surgery due to the Covid test.  "Why did they not tell me this two weeks ago when I talked to them.  This is very frustrating.  I had made arrangements.  This is affecting my plans that I have made for the rest of the year.  When can we reschedule it to?"  Dr. Eleanora Neighbor next available date is June 05, 2019.  "That's a long time.  You don't have anything sooner?  He had told me before that he sometimes do them on different days."  Dr. March Rummage does not have any other time available.  "Okay schedule me for October 28 but put me on the wait list and call me if there's a cancellation before the twenty-eighth."  I will.  I apologize.  "It's not your fault."

## 2019-05-01 NOTE — Telephone Encounter (Signed)
"  Is it too late to reschedule my surgery back to 10/07, the original date?  I'm just going to reschedule my trip."  It's not too late.  "Okay great, I left a message for the surgical center to call me so I can schedule the Covid test."  I rescheduled her surgery back to October 7 via the surgical center's [facility] [employer] Portal.

## 2019-05-10 ENCOUNTER — Telehealth: Payer: Self-pay | Admitting: *Deleted

## 2019-05-10 NOTE — Telephone Encounter (Signed)
DOS 05/15/2019 [facility] - [id number] VS. CHEILECTOMY - 79150, CALCANEAL OSTECTOMY - [id number], AND TENOLYSIS - 38377 OF THE LEFT FOOT  BCBS: Eligibility Date - 07/11/2016 - 08/07/9998   In-Network    Max Per Benefit Period Year-to-Date Remaining  CoInsurance     Deductible  $300.00 $0.00  Out-Of-Pocket 3  $1,500.00 $0.00   In-Network  [employer] Coinsurance Authorization Required  Not Applicable  93%  NO PER MARKEA - REF. # [id number]   Benefit: 01/21/2019 - 08/07/9998  Utilization Management Organization: UTILIZATION MANAGEMENT Telephone: [phone]

## 2019-05-15 ENCOUNTER — Other Ambulatory Visit: Payer: Self-pay | Admitting: Podiatry

## 2019-05-15 ENCOUNTER — Encounter: Payer: Self-pay | Admitting: Podiatry

## 2019-05-15 DIAGNOSIS — M216X2 Other acquired deformities of left foot: Secondary | ICD-10-CM

## 2019-05-15 DIAGNOSIS — M2022 Hallux rigidus, left foot: Secondary | ICD-10-CM | POA: Diagnosis not present

## 2019-05-15 MED ORDER — ONDANSETRON HCL 4 MG PO TABS: 4.0000 mg | ORAL_TABLET | Freq: Three times a day (TID) | ORAL | 0 refills | Status: DC | PRN

## 2019-05-15 MED ORDER — CEPHALEXIN 500 MG PO CAPS: 500.0000 mg | ORAL_CAPSULE | Freq: Two times a day (BID) | ORAL | 0 refills | Status: DC

## 2019-05-15 MED ORDER — OXYCODONE-ACETAMINOPHEN 10-325 MG PO TABS: 1.0000 | ORAL_TABLET | ORAL | 0 refills | Status: DC | PRN

## 2019-05-15 NOTE — Addendum Note (Signed)
Addended by: Hardie Pulley on: 05/15/2019 09:57 AM   Modules accepted: Orders

## 2019-05-15 NOTE — Progress Notes (Signed)
Rx sent to pharmacy for outpatient sx 

## 2019-05-16 ENCOUNTER — Telehealth: Payer: Self-pay

## 2019-05-16 NOTE — Telephone Encounter (Signed)
POST OP CALL-    1) General condition stated by the patient:   2) Is the pt having pain? Still numb  3) Pain score:   4) Has the pt taken Rx'd pain medication, regularly or PRN?   5) Is the pain medication giving relief?  6) Any fever, chills, nausea, or vomiting, shortness of breath or tightness in calf? Slight shortness of breath due to sore throat from the tube during surgery.  7) Is the bandage clean, dry and intact? Yes  8) Is there excessive tightness, bleeding or drainage coming through the bandage? Small spot of dry blood, no active bleeding.  9) Did you understand all of the post op instruction sheet given? Yes  10) Any questions or concerns regarding post op care/recovery? Pt asked about anti-nausea medication that was prescribed. Instructed pt if she did not have nausea she did not have to take the medication.    Confirmed POV appointment with patient

## 2019-05-17 ENCOUNTER — Emergency Department (HOSPITAL_COMMUNITY)
Admission: EM | Admit: 2019-05-17 | Discharge: 2019-05-17 | Disposition: A | Discharge status: Home / Self Care | Payer: BC Managed Care – PPO | Attending: Emergency Medicine | Admitting: Emergency Medicine

## 2019-05-17 ENCOUNTER — Telehealth: Payer: Self-pay | Admitting: *Deleted

## 2019-05-17 ENCOUNTER — Encounter (HOSPITAL_COMMUNITY): Payer: Self-pay

## 2019-05-17 ENCOUNTER — Other Ambulatory Visit: Payer: Self-pay

## 2019-05-17 DIAGNOSIS — H1131 Conjunctival hemorrhage, right eye: Secondary | ICD-10-CM | POA: Insufficient documentation

## 2019-05-17 DIAGNOSIS — J45909 Unspecified asthma, uncomplicated: Secondary | ICD-10-CM | POA: Insufficient documentation

## 2019-05-17 DIAGNOSIS — H5789 Other specified disorders of eye and adnexa: Secondary | ICD-10-CM | POA: Diagnosis present

## 2019-05-17 DIAGNOSIS — Z79899 Other long term (current) drug therapy: Secondary | ICD-10-CM | POA: Insufficient documentation

## 2019-05-17 HISTORY — DX: Unspecified asthma, uncomplicated: J45.909

## 2019-05-17 HISTORY — DX: Dizziness and giddiness: R42

## 2019-05-17 NOTE — Telephone Encounter (Signed)
Patient called stated that her vision has become very blurry, can not focus and has several burst capillaries in her eye.  I told patient to proceed to the ER immediately.  She asked if this is something that is normal after anesthesia?  I told her I was unaware of this being a side effect.  Patient states she will go to [facility].

## 2019-05-17 NOTE — Discharge Instructions (Signed)
Follow-up with ophthalmology if your symptoms or not improving in the next 2 days.  The contact information for Dr. Manuella Ghazi has been provided in this discharge summary for you to call and make these arrangements.  Return to the emergency department in the meantime if you develop worsening pain, worsening visual disturbances, or other new and concerning symptoms.

## 2019-05-17 NOTE — ED Triage Notes (Signed)
Patient states she hd redness of he right eye last night and today her vision "is going in and out." Patient sttes, "I can't focus with right eye."  Patient states she had left foot surgery 2 days ago and has blurred vision and headache after anesthesia..   Patient states she took an Oxycodone 1 1/2 hours prior to coming to the ED.

## 2019-05-17 NOTE — ED Provider Notes (Signed)
[facility] Provider Note   CSN: [id number] Arrival date & time: 05/17/19  1127     History   Chief Complaint Chief Complaint  Patient presents with  . Loss of Vision  . eye redness    HPI Brenda Lewis is a 53 y.o. female.     Patient is a 53 year old female with past medical history of asthma and vertigo.  Patient recently underwent surgery on her left foot 2 days ago during which she had general anesthesia.  Since that time, she has had blurry vision and irritation to her right eye.  She is also noticed that there is a small area of redness to the sclera medial to the iris.  She denies visual field cuts.  The history is provided by the patient.    Past Medical History:  Diagnosis Date  . Asthma   . Vertigo     Patient Active Problem List   Diagnosis Date Noted  . Osteoarthritis of carpometacarpal (CMC) joint of thumb 03/02/2019  . Bilateral carpal tunnel syndrome 01/02/2019  . Dupuytren's disease of palm 01/02/2019  . Pain of left hand 01/02/2019  . Stenosing tenosynovitis 01/02/2019    Past Surgical History:  Procedure Laterality Date  . ADENOIDECTOMY    . APPENDECTOMY    . CHOLECYSTECTOMY    . FOOT SURGERY    . TONSILLECTOMY       OB History   No obstetric history on file.      Home Medications    Prior to Admission medications   Medication Sig Start Date End Date Taking? Authorizing Provider  albuterol (VENTOLIN HFA) 108 (90 Base) MCG/ACT inhaler albuterol sulfate HFA 90 mcg/actuation aerosol inhaler    [provider]  ALPRAZolam (XANAX) 0.25 MG tablet TAKE 1 TAB(S) ORALLY 2 TIMES A DAY FOR 7 DAY(S) AS NEEDED 01/25/19   [provider]  cephALEXin (KEFLEX) 500 MG capsule Take 1 capsule (500 mg total) by mouth 2 (two) times daily. 05/15/19   Park Liter, DPM  estradiol (ESTRACE) 0.5 MG tablet  06/27/18   [provider]  ondansetron (ZOFRAN) 4 MG tablet Take 1 tablet (4 mg total) by  mouth every 8 (eight) hours as needed for nausea or vomiting. 05/15/19   Park Liter, DPM  oxyCODONE-acetaminophen (PERCOCET) 10-325 MG tablet Take 1 tablet by mouth every 4 (four) hours as needed for pain. 05/15/19   Park Liter, DPM  progesterone (PROMETRIUM) 200 MG capsule  08/26/18   [provider]    Family History Family History  Problem Relation Age of Onset  . Rheum arthritis Father     Social History Social History   Tobacco Use  . Smoking status: Never Smoker  . Smokeless tobacco: Never Used  Substance Use Topics  . Alcohol use: Yes    Comment: occasionally  . Drug use: Never     Allergies   Gadolinium derivatives, Contrast media [iodinated diagnostic agents], and Codeine   Review of Systems Review of Systems  All other systems reviewed and are negative.    Physical Exam Updated Vital Signs BP 121/75 (BP Location: Right Arm)   Pulse 68   Temp 98.1 F (36.7 C) (Oral)   Resp 16   Ht 5\' 5"  (1.651 m)   Wt 71.2 kg   SpO2 93%   BMI 26.13 kg/m   Physical Exam Vitals signs and nursing note reviewed.  Constitutional:      General: She is not in acute distress.  Appearance: Normal appearance. She is not ill-appearing.  HENT:     Head: Normocephalic and atraumatic.  Eyes:     Comments: Examination of the right eye reveals a small sub-conjunctival hemorrhage to the medial aspect of the sclera.  The cornea appears clear and the pupil is round and reactive.  Funduscopic examination is somewhat limited secondary to discomfort, however I see no obvious abnormalities.  On exam, patient has no visual field cuts.  Pulmonary:     Effort: Pulmonary effort is normal.  Skin:    General: Skin is warm and dry.  Neurological:     Mental Status: She is alert and oriented to person, place, and time.      ED Treatments / Results  Labs (all labs ordered are listed, but only abnormal results are displayed) Labs Reviewed - No data to display  EKG  None  Radiology No results found.  Procedures Procedures (including critical care time)  Medications Ordered in ED Medications - No data to display   Initial Impression / Assessment and Plan / ED Course  I have reviewed the triage vital signs and the nursing notes.  Pertinent labs & imaging results that were available during my care of the patient were reviewed by me and considered in my medical decision making (see chart for details).  Patient presenting here with complaints of blurry vision, eye irritation, and redness to the white part of her eye.  Patient recently had general anesthesia for surgery on her foot.  I suspect the patient sustained a corneal or scleral abrasion during this procedure.  I see nothing with the anterior chamber of the eye that appears abnormal and see nothing on funduscopic exam that is of concern.  Patient has vision in all 4 quadrants.  This was discussed with Dr. Manuella Ghazi from ophthalmology.  He agrees with my assessment and is willing to see the patient in the office if not improving in the next 2 days.  Final Clinical Impressions(s) / ED Diagnoses   Final diagnoses:  None    ED Discharge Orders    None       Veryl Speak, MD 05/17/19 1351

## 2019-05-18 NOTE — Telephone Encounter (Signed)
Per chart review patient had a subconjunctival hemorrhage. Called and spoke to patient, patient's vision is slowly improving. Pain controlled to the foot. She has appt for f/u with an eye doctor in case this does not improve.  Will f/u with patient in the office Monday morning

## 2019-05-20 ENCOUNTER — Other Ambulatory Visit: Payer: Self-pay | Admitting: Podiatry

## 2019-05-20 ENCOUNTER — Other Ambulatory Visit: Payer: Self-pay

## 2019-05-20 ENCOUNTER — Ambulatory Visit (INDEPENDENT_AMBULATORY_CARE_PROVIDER_SITE_OTHER): Payer: BC Managed Care – PPO | Admitting: Podiatry

## 2019-05-20 ENCOUNTER — Ambulatory Visit (INDEPENDENT_AMBULATORY_CARE_PROVIDER_SITE_OTHER): Payer: BC Managed Care – PPO

## 2019-05-20 DIAGNOSIS — Z9889 Other specified postprocedural states: Secondary | ICD-10-CM

## 2019-05-20 DIAGNOSIS — M9262 Juvenile osteochondrosis of tarsus, left ankle: Secondary | ICD-10-CM | POA: Diagnosis not present

## 2019-05-20 DIAGNOSIS — M2022 Hallux rigidus, left foot: Secondary | ICD-10-CM | POA: Diagnosis not present

## 2019-05-20 DIAGNOSIS — M7732 Calcaneal spur, left foot: Secondary | ICD-10-CM

## 2019-05-20 DIAGNOSIS — M9261 Juvenile osteochondrosis of tarsus, right ankle: Secondary | ICD-10-CM

## 2019-05-28 ENCOUNTER — Other Ambulatory Visit: Payer: Self-pay | Admitting: Podiatry

## 2019-05-30 ENCOUNTER — Encounter: Payer: BC Managed Care – PPO | Admitting: Sports Medicine

## 2019-06-04 ENCOUNTER — Other Ambulatory Visit: Payer: Self-pay

## 2019-06-04 ENCOUNTER — Ambulatory Visit (INDEPENDENT_AMBULATORY_CARE_PROVIDER_SITE_OTHER): Payer: BC Managed Care – PPO | Admitting: Podiatry

## 2019-06-04 DIAGNOSIS — M9261 Juvenile osteochondrosis of tarsus, right ankle: Secondary | ICD-10-CM

## 2019-06-04 DIAGNOSIS — Z9889 Other specified postprocedural states: Secondary | ICD-10-CM

## 2019-06-04 NOTE — Progress Notes (Signed)
  Subjective:  Patient ID: Brenda Lewis, female    DOB: April 22, 1966,  MRN: [id number]  No chief complaint on file.  DOS: 05/15/2019 Procedure: Austin Bunionectomy L, Haglund's Resection L, Gastrocnemius Recession L  53 y.o. female returns for post-op check. Doing well pain controlled. Cast intact prior to removal. Eye issue resolved. Denies post-op issues or concerns.  Review of Systems: Negative except as noted in the HPI. Denies N/V/F/Ch.  Past Medical History:  Diagnosis Date  . Asthma   . Vertigo     Current Outpatient Medications:  .  albuterol (VENTOLIN HFA) 108 (90 Base) MCG/ACT inhaler, albuterol sulfate HFA 90 mcg/actuation aerosol inhaler, Disp: , Rfl:  .  ALPRAZolam (XANAX) 0.25 MG tablet, TAKE 1 TAB(S) ORALLY 2 TIMES A DAY FOR 7 DAY(S) AS NEEDED, Disp: , Rfl:  .  cephALEXin (KEFLEX) 500 MG capsule, Take 1 capsule (500 mg total) by mouth 2 (two) times daily., Disp: 14 capsule, Rfl: 0 .  estradiol (ESTRACE) 0.5 MG tablet, , Disp: , Rfl:  .  ondansetron (ZOFRAN) 4 MG tablet, Take 1 tablet (4 mg total) by mouth every 8 (eight) hours as needed for nausea or vomiting., Disp: 20 tablet, Rfl: 0 .  oxyCODONE-acetaminophen (PERCOCET) 10-325 MG tablet, Take 1 tablet by mouth every 4 (four) hours as needed for pain., Disp: 20 tablet, Rfl: 0 .  progesterone (PROMETRIUM) 200 MG capsule, , Disp: , Rfl:   Social History   Tobacco Use  Smoking Status Never Smoker  Smokeless Tobacco Never Used    Allergies  Allergen Reactions  . Gadolinium Derivatives Anaphylaxis, Shortness Of Breath and Hives  . Contrast Media [Iodinated Diagnostic Agents]   . Codeine Other (See Comments)   Objective:  There were no vitals filed for this visit. There is no height or weight on file to calculate BMI. Constitutional Well developed. Well nourished.  Vascular Foot warm and well perfused. Capillary refill normal to all digits.   Neurologic Normal speech. Oriented to person, place, and time.  Epicritic sensation to light touch grossly present bilaterally.  Dermatologic Skin healing well without signs of infection. Skin edges well coapted without signs of infection.  Orthopedic: Tenderness to palpation noted about the surgical site. Negative calf pain negative Homan's   Assessment:   1. Haglund's deformity of right heel   2. Post-operative state    Plan:  Patient was evaluated and treated and all questions answered.  S/p foot surgery left -Progressing as expected post-operatively. -XR: AS above -WB Status: NWB with knee scooter -Sutures: staples removed from gastrocnemius incision area. Steris applied. Others left intact -F/u in 2 weeks. Start PT in 2 weeks. Referral filled out.  No follow-ups on file.

## 2019-06-04 NOTE — Progress Notes (Signed)
  Subjective:  Patient ID: Brenda Lewis, female    DOB: 01/31/1966,  MRN: [id number]  Chief Complaint  Patient presents with  . Routine Post Op    POV#1 Pt. states," Pt. states," it's fine." -pain is controlled w/ pain meds or IBU Tx: oxy (only taken 3 times) and IBU -pt denie sN/V/F/Ch     DOS: 05/15/2019 Procedure: Austin Bunionectomy L, Haglund's Resection L, Gastrocnemius Recession L  53 y.o. female returns for post-op check. Hx as above.  Review of Systems: Negative except as noted in the HPI. Denies N/V/F/Ch.  Past Medical History:  Diagnosis Date  . Asthma   . Vertigo     Current Outpatient Medications:  .  albuterol (VENTOLIN HFA) 108 (90 Base) MCG/ACT inhaler, albuterol sulfate HFA 90 mcg/actuation aerosol inhaler, Disp: , Rfl:  .  ALPRAZolam (XANAX) 0.25 MG tablet, TAKE 1 TAB(S) ORALLY 2 TIMES A DAY FOR 7 DAY(S) AS NEEDED, Disp: , Rfl:  .  cephALEXin (KEFLEX) 500 MG capsule, Take 1 capsule (500 mg total) by mouth 2 (two) times daily., Disp: 14 capsule, Rfl: 0 .  estradiol (ESTRACE) 0.5 MG tablet, , Disp: , Rfl:  .  ondansetron (ZOFRAN) 4 MG tablet, Take 1 tablet (4 mg total) by mouth every 8 (eight) hours as needed for nausea or vomiting., Disp: 20 tablet, Rfl: 0 .  oxyCODONE-acetaminophen (PERCOCET) 10-325 MG tablet, Take 1 tablet by mouth every 4 (four) hours as needed for pain., Disp: 20 tablet, Rfl: 0 .  progesterone (PROMETRIUM) 200 MG capsule, , Disp: , Rfl:   Social History   Tobacco Use  Smoking Status Never Smoker  Smokeless Tobacco Never Used    Allergies  Allergen Reactions  . Gadolinium Derivatives Anaphylaxis, Shortness Of Breath and Hives  . Contrast Media [Iodinated Diagnostic Agents]   . Codeine Other (See Comments)   Objective:  There were no vitals filed for this visit. There is no height or weight on file to calculate BMI. Constitutional Well developed. Well nourished.  Vascular Foot warm and well perfused. Capillary refill normal  to all digits.   Neurologic Normal speech. Oriented to person, place, and time. Epicritic sensation to light touch grossly present bilaterally.  Dermatologic Skin healing well without signs of infection. Skin edges well coapted without signs of infection.  Orthopedic: Tenderness to palpation noted about the surgical site. Negative calf pain negative Homan's   Radiographs: Taken and reviewed c/w post-op state. Assessment:   1. Haglund's deformity of right heel   2. Post-operative state   3. Heel spur, left    Plan:  Patient was evaluated and treated and all questions answered.  S/p foot surgery left -Progressing as expected post-operatively. -XR: AS above -WB Status: NWB with knee scooter -Sutures: intact. -Medications: none refilled. -Short leg cast applied with 3 layers fiberglass cast -Foot redressed.  Return in about 2 weeks (around 06/03/2019).

## 2019-06-07 ENCOUNTER — Telehealth: Payer: Self-pay | Admitting: Urology

## 2019-06-07 NOTE — Telephone Encounter (Signed)
Pt called and would you to recommend a orthopedic surgeon. She said she  trust your opinion on this. Could you call or Text her at [phone]. Thank you.

## 2019-06-18 ENCOUNTER — Ambulatory Visit (INDEPENDENT_AMBULATORY_CARE_PROVIDER_SITE_OTHER): Payer: Self-pay | Admitting: Podiatry

## 2019-06-18 ENCOUNTER — Other Ambulatory Visit: Payer: Self-pay

## 2019-06-18 DIAGNOSIS — Z9889 Other specified postprocedural states: Secondary | ICD-10-CM

## 2019-06-18 NOTE — Progress Notes (Signed)
  Subjective:  Patient ID: Brenda Lewis, female    DOB: Mar 06, 1966,  MRN: [id number]  Chief Complaint  Patient presents with  . Routine Post Op    POV Pt. states," pretty good, but hte boot hits the staples at the back of my heel and makes it very painful. Feels like it hits a nerve." Tx: knee scooter, boot and IBU -pt dneies N/V/?FCh -pt stats she had to wre-wrapp due to itching    DOS: 05/15/2019 Procedure: Austin Bunionectomy L, Haglund's Resection L, Gastrocnemius Recession L  53 y.o. female returns for post-op check. Hx above confirmed with patient.  Review of Systems: Negative except as noted in the HPI. Denies N/V/F/Ch.  Past Medical History:  Diagnosis Date  . Asthma   . Vertigo     Current Outpatient Medications:  .  albuterol (VENTOLIN HFA) 108 (90 Base) MCG/ACT inhaler, albuterol sulfate HFA 90 mcg/actuation aerosol inhaler, Disp: , Rfl:  .  ALPRAZolam (XANAX) 0.25 MG tablet, TAKE 1 TAB(S) ORALLY 2 TIMES A DAY FOR 7 DAY(S) AS NEEDED, Disp: , Rfl:  .  cephALEXin (KEFLEX) 500 MG capsule, Take 1 capsule (500 mg total) by mouth 2 (two) times daily., Disp: 14 capsule, Rfl: 0 .  estradiol (ESTRACE) 0.5 MG tablet, , Disp: , Rfl:  .  ondansetron (ZOFRAN) 4 MG tablet, Take 1 tablet (4 mg total) by mouth every 8 (eight) hours as needed for nausea or vomiting., Disp: 20 tablet, Rfl: 0 .  oxyCODONE-acetaminophen (PERCOCET) 10-325 MG tablet, Take 1 tablet by mouth every 4 (four) hours as needed for pain., Disp: 20 tablet, Rfl: 0 .  progesterone (PROMETRIUM) 200 MG capsule, , Disp: , Rfl:   Social History   Tobacco Use  Smoking Status Never Smoker  Smokeless Tobacco Never Used    Allergies  Allergen Reactions  . Gadolinium Derivatives Anaphylaxis, Shortness Of Breath and Hives  . Contrast Media [Iodinated Diagnostic Agents]   . Codeine Other (See Comments)   Objective:  There were no vitals filed for this visit. There is no height or weight on file to calculate BMI.  Constitutional Well developed. Well nourished.  Vascular Foot warm and well perfused. Capillary refill normal to all digits.   Neurologic Normal speech. Oriented to person, place, and time. Epicritic sensation to light touch grossly present bilaterally.  Dermatologic Skin healing well without signs of infection. Skin edges well coapted without signs of infection.  Orthopedic: No tenderness to palpation, good ROM of ankle with the Achilles palpable without dell along its course. Good 1st MPJ ROM without pain on ROM.   Assessment:   1. Post-operative state    Plan:  Patient was evaluated and treated and all questions answered.  S/p foot surgery left -Progressing as expected post-operatively. -XR: AS above -WB Status: NWB with knee scooter -Sutures: staples removed,ok to shower. Suture removal at next visit.  -F/u in 2 weeks. Start PT. Start PROM, then AROM. Plan to start WB in 2 weeks.  No follow-ups on file.

## 2019-07-08 ENCOUNTER — Ambulatory Visit (INDEPENDENT_AMBULATORY_CARE_PROVIDER_SITE_OTHER): Payer: BC Managed Care – PPO | Admitting: Podiatry

## 2019-07-08 ENCOUNTER — Ambulatory Visit (INDEPENDENT_AMBULATORY_CARE_PROVIDER_SITE_OTHER): Payer: BC Managed Care – PPO

## 2019-07-08 ENCOUNTER — Other Ambulatory Visit: Payer: Self-pay

## 2019-07-08 DIAGNOSIS — Z9889 Other specified postprocedural states: Secondary | ICD-10-CM

## 2019-07-08 DIAGNOSIS — R600 Localized edema: Secondary | ICD-10-CM | POA: Diagnosis not present

## 2019-07-08 DIAGNOSIS — M9261 Juvenile osteochondrosis of tarsus, right ankle: Secondary | ICD-10-CM

## 2019-07-08 MED ORDER — DOXYCYCLINE HYCLATE 100 MG PO TABS: 100.0000 mg | ORAL_TABLET | Freq: Two times a day (BID) | ORAL | 0 refills | Status: DC

## 2019-07-08 NOTE — Progress Notes (Signed)
  Subjective:  Patient ID: Brenda Lewis, female    DOB: 08-30-1965,  MRN: [id number]  Chief Complaint  Patient presents with  . Post-op Problem    pt c/o increase of pain Since Sat , with more redness and swellign and drainage noticed this morning; 8-9/10 tender to the otuch -pt dneis N/V/?FCH Tx: neosporin, boot and wlakier   DOS: 05/15/2019 Procedure: Austin Bunionectomy L, Haglund's Resection L, Gastrocnemius Recession L  53 y.o. female returns for post-op check. Hx above confirmed with patient. States she was doing well up until Friday. She was on a long car trip  Review of Systems: Negative except as noted in the HPI. Denies N/V/F/Ch.  Past Medical History:  Diagnosis Date  . Asthma   . Vertigo     Current Outpatient Medications:  .  albuterol (VENTOLIN HFA) 108 (90 Base) MCG/ACT inhaler, albuterol sulfate HFA 90 mcg/actuation aerosol inhaler, Disp: , Rfl:  .  ALPRAZolam (XANAX) 0.25 MG tablet, TAKE 1 TAB(S) ORALLY 2 TIMES A DAY FOR 7 DAY(S) AS NEEDED, Disp: , Rfl:  .  cephALEXin (KEFLEX) 500 MG capsule, Take 1 capsule (500 mg total) by mouth 2 (two) times daily., Disp: 14 capsule, Rfl: 0 .  doxycycline (VIBRA-TABS) 100 MG tablet, Take 1 tablet (100 mg total) by mouth 2 (two) times daily., Disp: 14 tablet, Rfl: 0 .  estradiol (ESTRACE) 0.5 MG tablet, , Disp: , Rfl:  .  ondansetron (ZOFRAN) 4 MG tablet, Take 1 tablet (4 mg total) by mouth every 8 (eight) hours as needed for nausea or vomiting., Disp: 20 tablet, Rfl: 0 .  oxyCODONE-acetaminophen (PERCOCET) 10-325 MG tablet, Take 1 tablet by mouth every 4 (four) hours as needed for pain., Disp: 20 tablet, Rfl: 0 .  progesterone (PROMETRIUM) 200 MG capsule, , Disp: , Rfl:   Social History   Tobacco Use  Smoking Status Never Smoker  Smokeless Tobacco Never Used    Allergies  Allergen Reactions  . Gadolinium Derivatives Anaphylaxis, Shortness Of Breath and Hives  . Contrast Media [Iodinated Diagnostic Agents]   .  Codeine Other (See Comments)   Objective:  There were no vitals filed for this visit. There is no height or weight on file to calculate BMI. Constitutional Well developed. Well nourished.  Vascular Foot warm and well perfused. Capillary refill normal to all digits.   Neurologic Normal speech. Oriented to person, place, and time. Epicritic sensation to light touch grossly present bilaterally.  Dermatologic Skin healed proximally small area distally with overlying scab and periwound redness, erythema.  Orthopedic: Posterior heel left with TTP. Good 1st MPJ ROM without pain on ROM.   Assessment:   1. Haglund's deformity of right heel   2. Post-operative state   3. Localized edema    Plan:  Patient was evaluated and treated and all questions answered.  S/p foot surgery left -Small periwound redness noted. Rx Doxycycline -Sutures partially removed today after application of numbing cream -Unna boot applied today. Leave intact 1 week -Symptoms not consistent with DVT; is at higher risk given recent sx and travel  Return in about 1 week (around 07/15/2019) for Post-Op (No XRs).

## 2019-07-09 ENCOUNTER — Encounter: Payer: BC Managed Care – PPO | Admitting: Podiatry

## 2019-07-15 ENCOUNTER — Ambulatory Visit (INDEPENDENT_AMBULATORY_CARE_PROVIDER_SITE_OTHER): Payer: BC Managed Care – PPO | Admitting: Podiatry

## 2019-07-15 ENCOUNTER — Other Ambulatory Visit: Payer: Self-pay

## 2019-07-15 DIAGNOSIS — M2012 Hallux valgus (acquired), left foot: Secondary | ICD-10-CM

## 2019-07-15 DIAGNOSIS — Z9889 Other specified postprocedural states: Secondary | ICD-10-CM

## 2019-07-15 NOTE — Progress Notes (Signed)
  Subjective:  Patient ID: Brenda Lewis, female    DOB: 01/18/1966,  MRN: [id number]  Chief Complaint  Patient presents with  . Routine Post Op    POV Pt. states," it's much better, it only hurts if I walk too much; 2/10 ahcing pains." -w/ occasional swellign -pt denies redness Tx: IBU and icign    DOS: 05/15/2019 Procedure: Austin Bunionectomy L, Haglund's Resection L, Gastrocnemius Recession L  53 y.o. female returns for post-op check. Hx above confirmed with patient. States she was doing well up until Friday. She was on a long car trip  Review of Systems: Negative except as noted in the HPI. Denies N/V/F/Ch.  Past Medical History:  Diagnosis Date  . Asthma   . Vertigo     Current Outpatient Medications:  .  albuterol (VENTOLIN HFA) 108 (90 Base) MCG/ACT inhaler, albuterol sulfate HFA 90 mcg/actuation aerosol inhaler, Disp: , Rfl:  .  ALPRAZolam (XANAX) 0.25 MG tablet, TAKE 1 TAB(S) ORALLY 2 TIMES A DAY FOR 7 DAY(S) AS NEEDED, Disp: , Rfl:  .  cephALEXin (KEFLEX) 500 MG capsule, Take 1 capsule (500 mg total) by mouth 2 (two) times daily., Disp: 14 capsule, Rfl: 0 .  doxycycline (VIBRA-TABS) 100 MG tablet, Take 1 tablet (100 mg total) by mouth 2 (two) times daily., Disp: 14 tablet, Rfl: 0 .  estradiol (ESTRACE) 0.5 MG tablet, , Disp: , Rfl:  .  ondansetron (ZOFRAN) 4 MG tablet, Take 1 tablet (4 mg total) by mouth every 8 (eight) hours as needed for nausea or vomiting., Disp: 20 tablet, Rfl: 0 .  oxyCODONE-acetaminophen (PERCOCET) 10-325 MG tablet, Take 1 tablet by mouth every 4 (four) hours as needed for pain., Disp: 20 tablet, Rfl: 0 .  progesterone (PROMETRIUM) 200 MG capsule, , Disp: , Rfl:   Social History   Tobacco Use  Smoking Status Never Smoker  Smokeless Tobacco Never Used    Allergies  Allergen Reactions  . Gadolinium Derivatives Anaphylaxis, Shortness Of Breath and Hives  . Contrast Media [Iodinated Diagnostic Agents]   . Codeine Other (See Comments)    Objective:  There were no vitals filed for this visit. There is no height or weight on file to calculate BMI. Constitutional Well developed. Well nourished.  Vascular Foot warm and well perfused. Capillary refill normal to all digits.   Neurologic Normal speech. Oriented to person, place, and time. Epicritic sensation to light touch grossly present bilaterally.  Dermatologic Skin healed proximally small area distally with overlying scab and periwound redness, erythema.  Orthopedic: Posterior heel left with slight TTP. Good 1st MPJ ROM without pain but end range PF restriction    Assessment:   1. Acquired hallux valgus of left foot   2. Post-operative state    Plan:  Patient was evaluated and treated and all questions answered.  S/p foot surgery left -Wound healing well, remaining sutures removed. -Transition to surgical shoe. Wear for 1 week and then trial normal shoes -Ok for temporary handicap placard  Return in about 3 weeks (around 08/05/2019) for Post-Op (No XRs).

## 2019-08-05 ENCOUNTER — Other Ambulatory Visit: Payer: Self-pay

## 2019-08-05 ENCOUNTER — Ambulatory Visit (INDEPENDENT_AMBULATORY_CARE_PROVIDER_SITE_OTHER): Payer: BC Managed Care – PPO | Admitting: Podiatry

## 2019-08-05 DIAGNOSIS — M9261 Juvenile osteochondrosis of tarsus, right ankle: Secondary | ICD-10-CM

## 2019-08-05 DIAGNOSIS — M2012 Hallux valgus (acquired), left foot: Secondary | ICD-10-CM

## 2019-08-05 DIAGNOSIS — Z9889 Other specified postprocedural states: Secondary | ICD-10-CM

## 2019-08-05 NOTE — Progress Notes (Signed)
  Subjective:  Patient ID: Brenda Lewis, female    DOB: 1965-08-27,  MRN: [id number]  Chief Complaint  Patient presents with  . Routine Post Op    Pt. states," pretty good unless I don't stand/walk for a long time." Tx: IBU -w/ swellgin w/ prolong walking    DOS: 05/15/2019 Procedure: [place] Bunionectomy L, Haglund's Resection L, Gastrocnemius Recession L  53 y.o. female returns for post-op check.  Doing very well ambulating in normal shoe without issues only has a lot of pain from tightness from prolonged stationary positioning  Review of Systems: Negative except as noted in the HPI. Denies N/V/F/Ch.  Past Medical History:  Diagnosis Date  . Asthma   . Vertigo     Current Outpatient Medications:  .  albuterol (VENTOLIN HFA) 108 (90 Base) MCG/ACT inhaler, albuterol sulfate HFA 90 mcg/actuation aerosol inhaler, Disp: , Rfl:  .  ALPRAZolam (XANAX) 0.25 MG tablet, TAKE 1 TAB(S) ORALLY 2 TIMES A DAY FOR 7 DAY(S) AS NEEDED, Disp: , Rfl:  .  cephALEXin (KEFLEX) 500 MG capsule, Take 1 capsule (500 mg total) by mouth 2 (two) times daily., Disp: 14 capsule, Rfl: 0 .  doxycycline (VIBRA-TABS) 100 MG tablet, Take 1 tablet (100 mg total) by mouth 2 (two) times daily., Disp: 14 tablet, Rfl: 0 .  estradiol (ESTRACE) 0.5 MG tablet, , Disp: , Rfl:  .  ondansetron (ZOFRAN) 4 MG tablet, Take 1 tablet (4 mg total) by mouth every 8 (eight) hours as needed for nausea or vomiting., Disp: 20 tablet, Rfl: 0 .  oxyCODONE-acetaminophen (PERCOCET) 10-325 MG tablet, Take 1 tablet by mouth every 4 (four) hours as needed for pain., Disp: 20 tablet, Rfl: 0 .  progesterone (PROMETRIUM) 200 MG capsule, , Disp: , Rfl:   Social History   Tobacco Use  Smoking Status Never Smoker  Smokeless Tobacco Never Used    Allergies  Allergen Reactions  . Gadolinium Derivatives Anaphylaxis, Shortness Of Breath and Hives  . Contrast Media [Iodinated Diagnostic Agents]   . Codeine Other (See Comments)   Objective:    There were no vitals filed for this visit. There is no height or weight on file to calculate BMI. Constitutional Well developed. Well nourished.  Vascular Foot warm and well perfused. Capillary refill normal to all digits.   Neurologic Normal speech. Oriented to person, place, and time. Epicritic sensation to light touch grossly present bilaterally.  Dermatologic Skin healed proximally small area distally with overlying scab and periwound redness, erythema.  Orthopedic:  No pain palpation about the posterior heel good first MPJ range of motion without pain or endrange restriction   Assessment:   1. Acquired hallux valgus of left foot   2. Post-operative state   3. Haglund's deformity of right heel    Plan:  Patient was evaluated and treated and all questions answered.  S/p foot surgery left -Has completed physical therapy -Doing very well pleased with results only having pain when she stands for long period time.  At this point I advised that she can follow-up as needed should she have any issues continue stretching icing using a night splint for passive stretch.  Follow-up should she have any pain or issues  No follow-ups on file.

## 2020-05-06 ENCOUNTER — Ambulatory Visit: Payer: BC Managed Care – PPO | Admitting: Family Medicine

## 2020-05-06 ENCOUNTER — Ambulatory Visit: Payer: Self-pay

## 2020-05-06 ENCOUNTER — Encounter: Payer: Self-pay | Admitting: Family Medicine

## 2020-05-06 ENCOUNTER — Other Ambulatory Visit: Payer: Self-pay

## 2020-05-06 VITALS — BP 130/80 | HR 76 | Ht 65.0 in | Wt 149.8 lb

## 2020-05-06 DIAGNOSIS — M79621 Pain in right upper arm: Secondary | ICD-10-CM | POA: Diagnosis not present

## 2020-05-06 NOTE — Progress Notes (Signed)
    Subjective:    CC: R bicep / upper arm pain  I, Brenda Lewis, LAT, ATC, am serving as scribe for Dr. Clementeen Graham.  HPI: Pt is a 54 y/o RHD female presenting w/ c/o R bicep/upper arm pain since last week when she was walking her 70# dog and thinks she may have overextended her R elbow and pulled back into R elbow flexion.  Radiating pain: yes from R axilla to R forearm R upper arm swelling: yes in R distal upper arm Aggravating factors: R elbow extension and R shoulder flexion/reaching Treatments tried: IBU  Pertinent review of Systems: No fevers or chills  Relevant historical information: Previous infection with Covid.  Has not been vaccinated yet.   Objective:    Vitals:   05/06/20 1113  BP: 130/80  Pulse: 76  SpO2: 98%   General: Well Developed, well nourished, and in no acute distress.   MSK: Right arm normal-appearing without bruising. Decreased range of motion elbow.  Lacks full extension by 10 degrees and supination by 5 degrees Tender palpation muscle belly biceps.  No particular tender proximal or distal biceps tendons. Strength painful with resisted flexion and supination reduced strength 4/5. Pulses capillary fill and sensation are intact distally.  Lab and Radiology Results  Diagnostic Limited MSK Ultrasound of: Right biceps Proximal biceps tendon intact normal-appearing Muscle belly normal-appearing ultrasound examination. Distal biceps tendon is intact appearing Impression: Normal-appearing examination of biceps    Impression and Recommendations:    Assessment and Plan: 54 y.o. female with biceps muscle strain or small tear.  Discussed options.  Plan for home exercise with eccentric stretches and strengthening program.  Also recommend compression with body helix compression sleeve and Voltaren gel.  Refer to physical therapy.  Patient will schedule physical therapy for about 3 weeks in the future.  If she is not significantly better by then she  should start physical therapy.  Recheck back with me in about 6 weeks as needed.  Return sooner if needed..   Orders Placed This Encounter  Procedures  . [place] LIMITED JOINT SPACE STRUCTURES UP RIGHT(NO LINKED CHARGES)    Order Specific Question:   Reason for Exam (SYMPTOM  OR DIAGNOSIS REQUIRED)    Answer:   R bicep pain    Order Specific Question:   Preferred imaging location?    Answer:   Adult nurse [facility]  . Ambulatory referral to Physical Therapy    Referral Priority:   Routine    Referral Type:   Physical Medicine    Referral Reason:   Specialty Services Required    Requested Specialty:   Physical Therapy    Number of Visits Requested:   1   No orders of the defined types were placed in this encounter.   Discussed warning signs or symptoms. Please see discharge instructions. Patient expresses understanding.   The above documentation has been reviewed and is accurate and complete Clementeen Graham, M.D.

## 2020-05-06 NOTE — Patient Instructions (Signed)
Thank you for coming in today.  I think you injured your bicep muscle.   Work on stretching and eccentric exercises where the biceps length slowly with a 1-2 pound hand weight.  Also use a hammer to slowly turn the hand palm side up and palm side down.   Try using voltaren gel topically.   I recommend you obtained a compression sleeve to help with your joint problems. There are many options on the market however I recommend obtaining a biceps Body Helix compression sleeve.  You can find information (including how to appropriate measure yourself for sizing) can be found at [email or web address].  Many of these products are health savings account (HSA) eligible.   If not improving schedule PT for about 3 weeks into the future.  Recheck with me or contact me if needed.

## 2021-01-18 ENCOUNTER — Ambulatory Visit: Payer: BC Managed Care – PPO | Admitting: Podiatry

## 2021-01-25 ENCOUNTER — Ambulatory Visit: Payer: BC Managed Care – PPO | Admitting: Podiatry

## 2021-01-25 ENCOUNTER — Ambulatory Visit (INDEPENDENT_AMBULATORY_CARE_PROVIDER_SITE_OTHER): Payer: BC Managed Care – PPO

## 2021-01-25 ENCOUNTER — Other Ambulatory Visit: Payer: Self-pay

## 2021-01-25 DIAGNOSIS — M7662 Achilles tendinitis, left leg: Secondary | ICD-10-CM | POA: Diagnosis not present

## 2021-01-25 DIAGNOSIS — M7732 Calcaneal spur, left foot: Secondary | ICD-10-CM | POA: Diagnosis not present

## 2021-01-25 DIAGNOSIS — M9262 Juvenile osteochondrosis of tarsus, left ankle: Secondary | ICD-10-CM | POA: Diagnosis not present

## 2021-01-25 DIAGNOSIS — R600 Localized edema: Secondary | ICD-10-CM

## 2021-01-25 MED ORDER — METHYLPREDNISOLONE 4 MG PO TBPK: ORAL_TABLET | ORAL | 0 refills | Status: DC

## 2021-01-25 NOTE — Progress Notes (Signed)
  Subjective:  Patient ID: Brenda Lewis, female    DOB: November 19, 1965,  MRN: [id number]  Chief Complaint  Patient presents with   Pain    Lt back heel and heel pain flare up -2 night ago with sever pain episode -w/ swelling -7/10 stabbing apin Tx: IBU, icng and elevation    55 y.o. female presents with the above complaint. History confirmed with patient.   Objective:  Physical Exam: warm, good capillary refill, no trophic changes or ulcerative lesions, normal DP and PT pulses, and normal sensory exam. Left Foot: POP left posterior achilles with local warmth, no erythema.  Right Foot: well healed. No pain   No images are attached to the encounter.  Radiographs: X-ray of the left ankle: Posterior bone fragments likely intratendinous no true recurrence of Haglund deformity. Good bone contour noted. Assessment:   1. Achilles tendinitis of left lower extremity   2. Heel spur, left   3. Haglund's deformity, left    Plan:  Patient was evaluated and treated and all questions answered.  Achilles Tendonitis -XR reviewed with patient -Rx for Medrol 6-day taper. Advised on risks, benefits, and alternatives of the medication. Pt not to take until the  -I think based upon XR she has continued pain and ossification of the Achilles tendon itself rather than the bone. The bone fragments appear to be intra-tendinous. Will order MRI to eval for partial tearing. Rx Medrol but on hold until after MRI.   No follow-ups on file.

## 2021-01-27 ENCOUNTER — Telehealth: Payer: Self-pay

## 2021-01-27 NOTE — Telephone Encounter (Signed)
Contacted patient's insurance company to obtained authorization for MRI of LT ankle w/o contrast. Authorization was not required for  [phone] Contacted [facility] and scheduled pt's appt for 01-28-21 for checking in at 12.  Contacted pt and advised her of her appt date and time, pt was instructed of location address and phone number, pt stated understanding

## 2021-01-28 ENCOUNTER — Ambulatory Visit (HOSPITAL_COMMUNITY)
Admission: RE | Admit: 2021-01-28 | Discharge: 2021-01-28 | Disposition: A | Discharge status: Home / Self Care | Payer: BC Managed Care – PPO | Source: Ambulatory Visit | Attending: Podiatry | Admitting: Podiatry

## 2021-01-28 ENCOUNTER — Other Ambulatory Visit: Payer: Self-pay

## 2021-01-28 DIAGNOSIS — M7662 Achilles tendinitis, left leg: Secondary | ICD-10-CM | POA: Diagnosis present

## 2021-01-29 ENCOUNTER — Other Ambulatory Visit: Payer: Self-pay | Admitting: Podiatry

## 2021-01-29 DIAGNOSIS — M7662 Achilles tendinitis, left leg: Secondary | ICD-10-CM

## 2021-02-15 ENCOUNTER — Other Ambulatory Visit: Payer: Self-pay

## 2021-02-15 ENCOUNTER — Ambulatory Visit: Payer: BC Managed Care – PPO | Admitting: Podiatry

## 2021-02-15 DIAGNOSIS — M216X2 Other acquired deformities of left foot: Secondary | ICD-10-CM

## 2021-02-15 DIAGNOSIS — M6780 Other specified disorders of synovium and tendon, unspecified site: Secondary | ICD-10-CM | POA: Diagnosis not present

## 2021-02-15 DIAGNOSIS — M7662 Achilles tendinitis, left leg: Secondary | ICD-10-CM

## 2021-02-15 MED ORDER — MELOXICAM 15 MG PO TABS: 15.0000 mg | ORAL_TABLET | Freq: Every day | ORAL | 0 refills | Status: DC

## 2021-02-15 NOTE — Progress Notes (Signed)
  Subjective:  Patient ID: Brenda Lewis, female    DOB: 08/27/1965,  MRN: [id number]  Chief Complaint  Patient presents with   Tendonitis    F/U Lt tendonitis -pt states,' no improvement; 7/10 pain at it's worse." -Tx: boot -w/ swelling   55 y.o. female presents with the above complaint. History confirmed with patient.   Objective:  Physical Exam: warm, good capillary refill, no trophic changes or ulcerative lesions, normal DP and PT pulses, and normal sensory exam. Left Foot: POP left posterior achilles without local warmth or erythema.  Right Foot: well healed. No pain    MRI 6/23 Study Result  Narrative & Impression  CLINICAL DATA:  Achilles tendinitis   EXAM: MRI OF THE LEFT ANKLE WITHOUT CONTRAST   TECHNIQUE: Multiplanar, multisequence MR imaging of the ankle was performed. No intravenous contrast was administered.   COMPARISON:  Radiograph 01/25/2021, 05/20/2019   FINDINGS: TENDONS   Peroneal: Peroneal longus tendon intact. Peroneal brevis intact.   Posteromedial: Posterior tibial tendon intact. Flexor digitorum longus tendon intact. Flexor hallucis longus tendon intact.   Anterior: Tibialis anterior tendon intact. Extensor hallucis longus tendon intact Extensor digitorum longus tendon intact.   Achilles: Evidence of prior Achilles tendon repair. There is insertional Achilles tendinosis with a deep and outer surface fraying and low-grade interstitial tearing. There is a a tiny ossification near the more anterior insertion (sagittal T1 image 12, and low T1 signal ossification along the more distal insertion, this is better seen on comparison radiograph. No full-thickness or retracted Achilles tendon tear.   Plantar Fascia: Intact.   LIGAMENTS   Lateral: Anterior talofibular ligament intact. Calcaneofibular ligament intact. Posterior talofibular ligament intact. Anterior and posterior tibiofibular ligaments intact.   Medial: Deltoid ligament  intact. Spring ligament intact.   CARTILAGE   Ankle Joint: No significant joint effusion. Normal ankle mortise. No chondral defect.   Subtalar Joints/Sinus Tarsi: Normal subtalar joints. No subtalar joint effusion. Normal sinus tarsi.   Bones: No other marrow signal abnormality. No acute fracture or dislocation.   Soft Tissue: No fluid collection or hematoma. Muscles are normal without edema or atrophy. Tarsal tunnel is normal.   IMPRESSION: Prior Achilles tendon repair. There is insertional Achilles tendinosis with deep in outer surface fraying and low-grade interstitial tearing. No full-thickness or retracted Achilles tear. Insertional Achilles enthesophytes/intratendinous ossification as seen on comparison radiograph.   Assessment:   1. Achilles tendinitis of left lower extremity   2. Heterotopic ossification of tendon   3. Acquired equinus deformity of left foot     Plan:  Patient was evaluated and treated and all questions answered.  Achilles Tendonitis -Rx for Meloxicam. Advised on risks, benefits, and alternatives of the medication.  -MRI reviewed with patient. -Restrict WB to boot for 3 weeks. -Should pain persist consider Achilles tendon debridement, consider micro-debridement  No follow-ups on file.

## 2021-03-18 ENCOUNTER — Other Ambulatory Visit: Payer: Self-pay

## 2021-03-18 ENCOUNTER — Ambulatory Visit: Payer: BC Managed Care – PPO | Admitting: Podiatry

## 2021-03-18 ENCOUNTER — Encounter: Payer: Self-pay | Admitting: Podiatry

## 2021-03-18 DIAGNOSIS — M7662 Achilles tendinitis, left leg: Secondary | ICD-10-CM

## 2021-03-18 DIAGNOSIS — M6780 Other specified disorders of synovium and tendon, unspecified site: Secondary | ICD-10-CM

## 2021-03-18 NOTE — Progress Notes (Signed)
  Subjective:  Patient ID: Brenda Lewis, female    DOB: 08-26-1965,  MRN: [id number]  Chief Complaint  Patient presents with   Foot Pain    The left foot is not so bad and I did twist it last night tripping over a dog and it has some sharp pains but I am ok and not sure if it is related to the other issue I have and the right hurts some due to having the boot on the left    55 y.o. female presents with the above complaint. History confirmed with patient.   Objective:  Physical Exam: warm, good capillary refill, no trophic changes or ulcerative lesions, normal DP and PT pulses, and normal sensory exam. Left Foot: POP left posterior achilles without local warmth or erythema.  Right Foot: well healed. Mild posterior pain    Assessment:   1. Achilles tendinitis of left lower extremity   2. Heterotopic ossification of tendon    Plan:  Patient was evaluated and treated and all questions answered.  Achilles Tendonitis -Improving. -Recc voltaren gel for topical anti-inflammatory properties. -Should pain persist consider Achilles tendon debridement, consider micro-debridement  No follow-ups on file.

## 2021-03-18 NOTE — Patient Instructions (Signed)
Patient to pick up voltaren gel in the pharmacy department

## 2021-04-08 ENCOUNTER — Ambulatory Visit: Payer: BC Managed Care – PPO | Admitting: Podiatry

## 2021-04-26 ENCOUNTER — Ambulatory Visit (INDEPENDENT_AMBULATORY_CARE_PROVIDER_SITE_OTHER): Payer: BC Managed Care – PPO | Admitting: Podiatry

## 2021-04-26 DIAGNOSIS — Z5329 Procedure and treatment not carried out because of patient's decision for other reasons: Secondary | ICD-10-CM

## 2021-12-30 DIAGNOSIS — M545 Low back pain, unspecified: Secondary | ICD-10-CM | POA: Insufficient documentation

## 2021-12-30 DIAGNOSIS — M542 Cervicalgia: Secondary | ICD-10-CM

## 2021-12-30 HISTORY — DX: Low back pain, unspecified: M54.50

## 2021-12-30 HISTORY — DX: Cervicalgia: M54.2

## 2022-04-29 ENCOUNTER — Ambulatory Visit (INDEPENDENT_AMBULATORY_CARE_PROVIDER_SITE_OTHER): Payer: BC Managed Care – PPO

## 2022-04-29 ENCOUNTER — Encounter: Payer: Self-pay | Admitting: Emergency Medicine

## 2022-04-29 ENCOUNTER — Ambulatory Visit
Admission: EM | Admit: 2022-04-29 | Discharge: 2022-04-29 | Disposition: A | Discharge status: Home / Self Care | Payer: BC Managed Care – PPO | Attending: Internal Medicine | Admitting: Internal Medicine

## 2022-04-29 DIAGNOSIS — M25572 Pain in left ankle and joints of left foot: Secondary | ICD-10-CM

## 2022-04-29 DIAGNOSIS — W19XXXA Unspecified fall, initial encounter: Secondary | ICD-10-CM

## 2022-04-29 DIAGNOSIS — M25562 Pain in left knee: Secondary | ICD-10-CM | POA: Diagnosis not present

## 2022-04-29 NOTE — ED Triage Notes (Addendum)
Pt is present today with c/o left ankle pain and left knee pain from a fall this morning. Pt states that she stepped off her ramp wrong this morning and and landed left knee. Pt denies LOC or hitting her head.

## 2022-04-29 NOTE — Discharge Instructions (Signed)
Your x-rays were normal.  Suspect sprain of both areas of pain.  Knee brace and ankle wrap have been applied.  Do not sleep in these.  Recommend ice application and elevation of extremities as well.  Follow-up with orthopedist if pain persists or worsens.

## 2022-04-29 NOTE — ED Provider Notes (Signed)
[facility]    CSN: [id number] Arrival date & time: 04/29/22  0843      History   Chief Complaint Chief Complaint  Patient presents with   Fall   Ankle Pain   Knee Pain    HPI Brenda Lewis is a 56 y.o. female.   Patient presents for further evaluation of left ankle and left knee pain after a fall that occurred at approximately 6:30 AM today.  Patient reports that she was walking outside when she missed stepped and twisted her left ankle.  She then subsequently fell and twisted her knee.  Denies hitting head or losing consciousness.  Patient has not taken any medications for pain.  Pain mainly occurs with bearing weight.   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      Home Medications    Prior to Admission medications   Medication Sig Start Date End Date Taking? Authorizing Provider  albuterol (VENTOLIN HFA) 108 (90 Base) MCG/ACT inhaler albuterol sulfate HFA 90 mcg/actuation aerosol inhaler    [provider]  ALPRAZolam (XANAX) 0.25 MG tablet TAKE 1 TAB(S) ORALLY 2 TIMES A DAY FOR 7 DAY(S) AS NEEDED 01/25/19   [provider]  ondansetron (ZOFRAN) 4 MG tablet Take 1 tablet (4 mg total) by mouth every 8 (eight) hours as needed for nausea or vomiting. 05/15/19   Park Liter, DPM  SKYRIZI PEN 150 MG/ML SOAJ Inject into the skin. 12/23/20   [provider]  triamcinolone cream (KENALOG) 0.1 % Apply  topically. 11/13/20   [provider]    Family History Family History  Problem Relation Age of Onset   Rheum arthritis Father     Social History Social History   Tobacco Use   Smoking status: Never   Smokeless tobacco: Never  Vaping Use   Vaping Use: Never used  Substance Use Topics   Alcohol use: Yes    Comment: occasionally   Drug use: Never     Allergies   Gadolinium derivatives, Contrast media [iodinated contrast media], and Codeine   Review of Systems Review of Systems Per HPI  Physical Exam Triage Vital Signs ED Triage Vitals [04/29/22 0857]  Enc Vitals Group     BP 135/87     Pulse Rate 86     Resp 18     Temp 97.9 F (36.6 C)     Temp src      SpO2 97 %     Weight      Height      Head Circumference      Peak Flow      Pain Score 4     Pain Loc      Pain Edu?      Excl. in GC?    No data found.  Updated Vital Signs BP 135/87  Pulse 86   Temp 97.9 F (36.6 C)   Resp 18   SpO2 97%   Visual Acuity Right Eye Distance:   Left Eye Distance:   Bilateral Distance:    Right Eye Near:   Left Eye Near:    Bilateral Near:     Physical Exam Constitutional:      General: She is not in acute distress.    Appearance: Normal appearance. She is not toxic-appearing or diaphoretic.  HENT:     Head: Normocephalic and atraumatic.  Eyes:     Extraocular Movements: Extraocular movements intact.     Conjunctiva/sclera: Conjunctivae normal.  Pulmonary:     Effort: Pulmonary effort is normal.  Musculoskeletal:     Left knee: No bony tenderness or crepitus. Tenderness present. No LCL laxity, MCL laxity, ACL laxity or PCL laxity.Normal alignment, normal meniscus and normal patellar mobility. Normal pulse.     Comments: Tenderness to palpation with mild associated swelling present to left lateral malleolus.  No tenderness to foot or toes.  No tenderness to lower leg.  No obvious discoloration noted.  Capillary refill and pulses normal.  Has  tenderness to palpation to lateral anterior knee.  No obvious swelling or discoloration noted.  No deformity noted.  Neurological:     General: No focal deficit present.     Mental Status: She is alert and oriented to person, place, and time. Mental status is at baseline.  Psychiatric:        Mood and Affect: Mood normal.        Behavior: Behavior normal.        Thought Content: Thought content normal.        Judgment: Judgment normal.      UC Treatments / Results  Labs (all labs ordered are listed, but only abnormal results are displayed) Labs Reviewed - No data to display  EKG   Radiology DG Ankle Complete Left  Result Date: 04/29/2022 CLINICAL DATA:  Fall, ankle pain EXAM: LEFT ANKLE COMPLETE - 3+ VIEW COMPARISON:  07/08/2019 FINDINGS: There is no evidence of fracture, dislocation, or joint effusion. There is no evidence of arthropathy or other focal bone abnormality. Soft tissues are unremarkable. IMPRESSION: No acute abnormality Electronically Signed   By: Jerilynn Mages.  Shick M.D.   On: 04/29/2022 09:49   DG Knee Complete 4 Views Left  Result Date: 04/29/2022 CLINICAL DATA:  Recent fall, pain EXAM: LEFT KNEE - COMPLETE 4+ VIEW COMPARISON:  04/29/2022 FINDINGS: No evidence of fracture, dislocation, or joint effusion. No evidence of arthropathy or other focal bone abnormality. Soft tissues are unremarkable. IMPRESSION: No acute abnormality or significant arthropathy Electronically Signed   By: Jerilynn Mages.  Shick M.D.   On: 04/29/2022 09:46    Procedures Procedures (including critical care time)  Medications Ordered in UC Medications - No data to display  Initial Impression / Assessment and Plan / UC Course  I have reviewed the triage vital signs and the nursing notes.  Pertinent labs & imaging results that were available during my care of the patient were reviewed by me and considered in my medical decision making (see chart for details).     X-ray of knee and ankle were negative for any  acute bony abnormality.  Suspect muscular strain/injury versus contusion given mechanism of injury.  Recommend supportive care, ice application, elevation of extremity.  Knee brace and Ace wrap applied in urgent care.  Advised of pain relief as needed.  Patient to follow-up with orthopedist at provided contact  information if symptoms persist or worsen.  Patient verbalized understanding and was agreeable with plan. Final Clinical Impressions(s) / UC Diagnoses   Final diagnoses:  Fall, initial encounter  Acute pain of left knee  Acute left ankle pain     Discharge Instructions      Your x-rays were normal.  Suspect sprain of both areas of pain.  Knee brace and ankle wrap have been applied.  Do not sleep in these.  Recommend ice application and elevation of extremities as well.  Follow-up with orthopedist if pain persists or worsens.     ED Prescriptions   None    PDMP not reviewed this encounter.   Gustavus Bryant, [place] 04/29/22 1022

## 2022-12-06 IMAGING — MR MR ANKLE*L* W/O CM
5 series · 37 of 40 positions shown · non-contrast
Comparison: Radiograph 01/25/2021, 05/20/2019

CLINICAL DATA: Achilles tendinitis

EXAM:
MRI OF THE LEFT ANKLE WITHOUT CONTRAST
TECHNIQUE: Multiplanar, multisequence MR imaging of the ankle was performed. No
intravenous contrast was administered.

[Series 3: T2 fat-sat · axial · left · 4.0mm · 0.42mm/px · z∈[-110,+75]mm · 8 of 38 slices shown (1 of 2)]
[im 1/38]
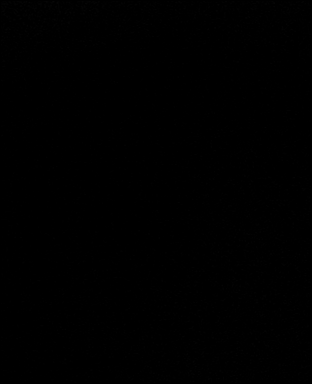
[im 5/38]
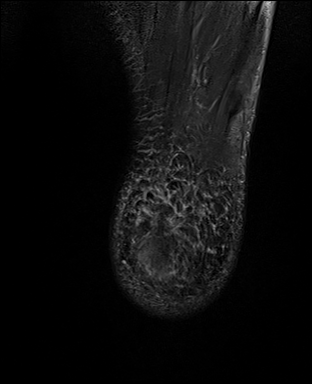
[im 13/38]
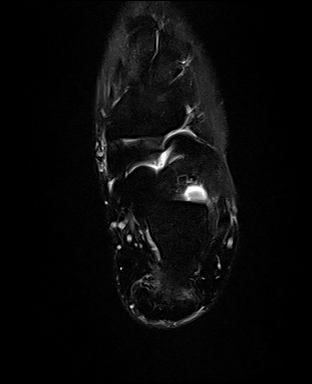
[im 17/38]
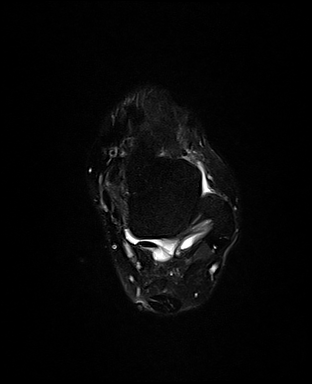
[im 21/38]
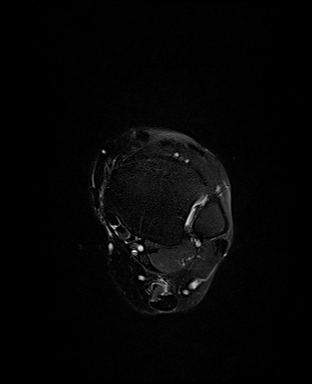
[im 25/38]
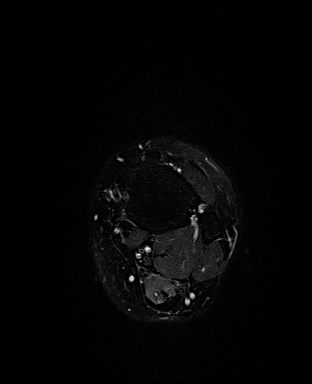
[im 33/38]
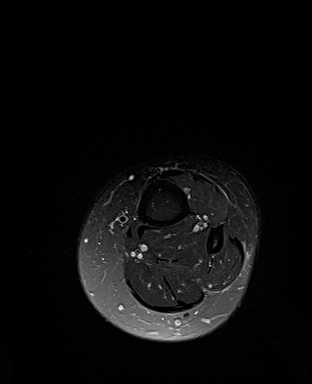
[im 38/38]
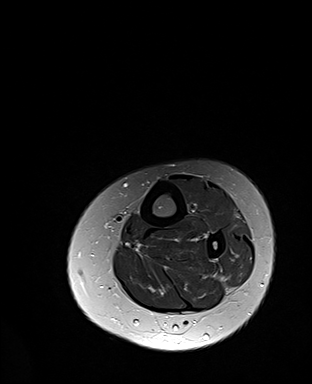

[Series 4: PD fat-sat · axial · left · 4.0mm · 0.50mm/px · z∈[-110,+75]mm · 9 of 38 slices shown]
[im 1/38]
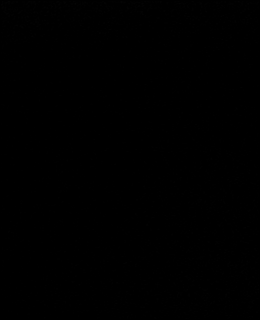
[im 5/38]
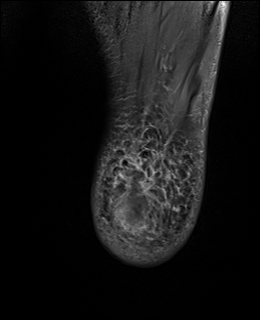
[im 10/38]
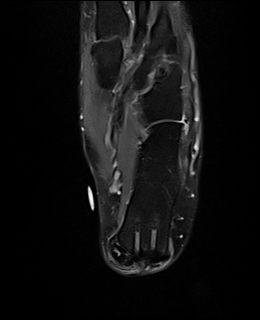
[im 14/38]
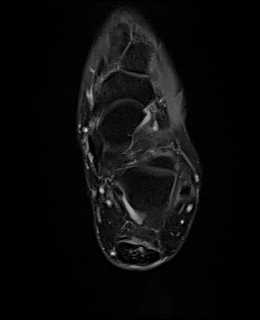
[im 19/38]
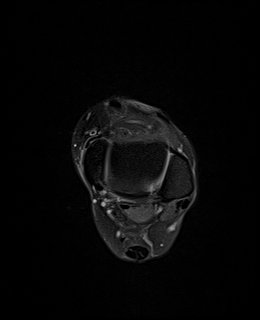
[im 24/38]
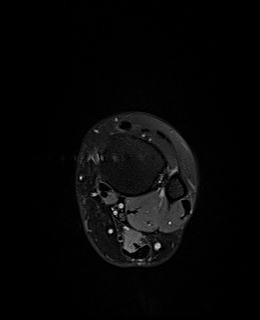
[im 28/38]
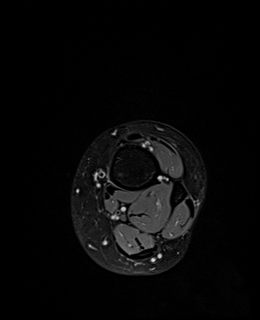
[im 33/38]
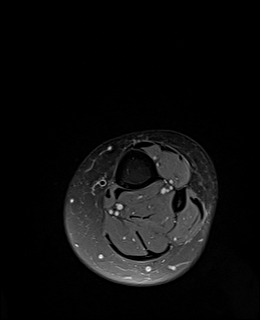
[im 38/38]
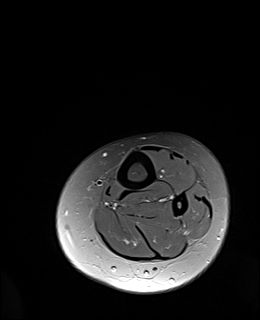

[Series 5: T2 fat-sat · coronal · left · 3.0mm · 0.53mm/px · 9 of 38 slices shown (2 of 2)]
[im 1/38]
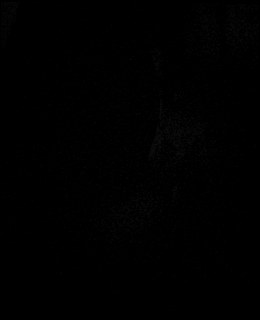
[im 5/38]
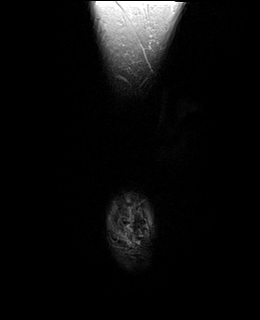
[im 10/38]
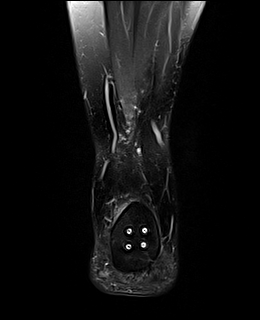
[im 14/38]
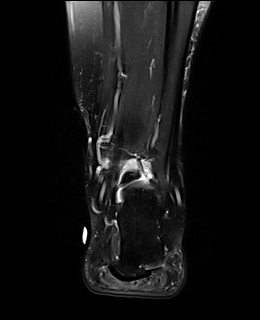
[im 19/38]
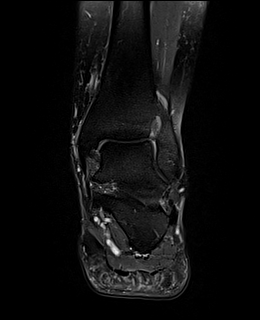
[im 24/38]
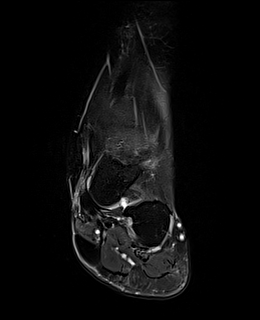
[im 28/38]
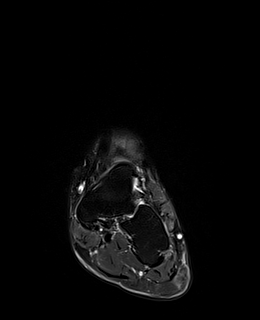
[im 33/38]
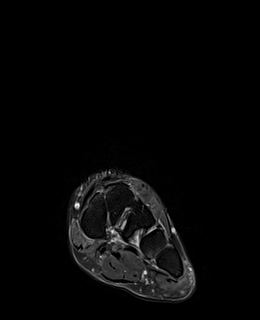
[im 38/38]
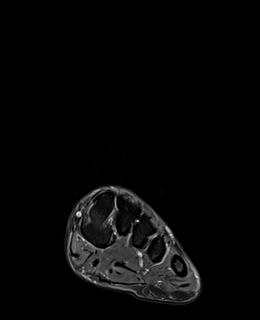

[Series 6: T1 · sagittal · left · 3.0mm · 0.50mm/px · 6 of 25 slices shown]
[im 1/25]
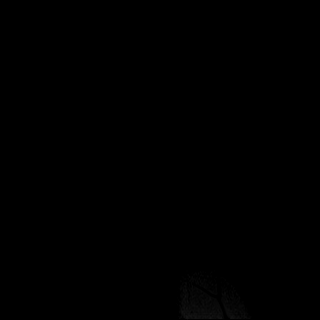
[im 5/25]
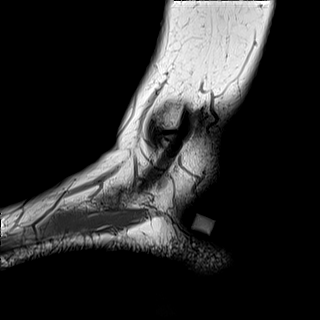
[im 10/25]
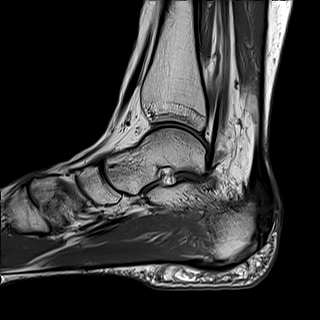
[im 15/25]
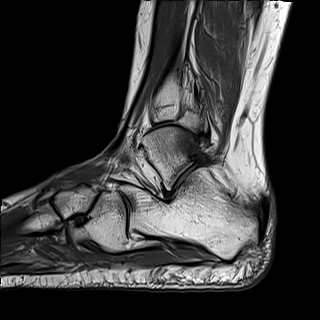
[im 20/25]
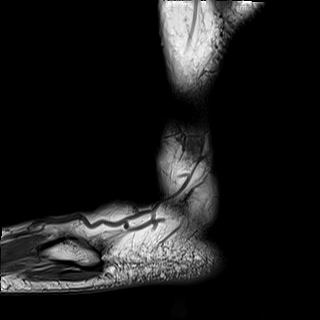
[im 25/25]
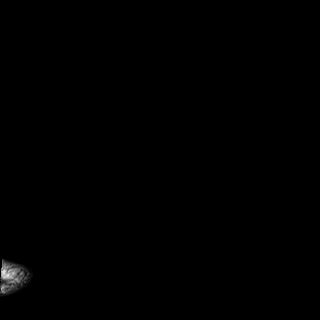

[Series 7: STIR · sagittal · left · 3.0mm · 0.31mm/px · 5 of 25 slices shown]
[im 1/25]
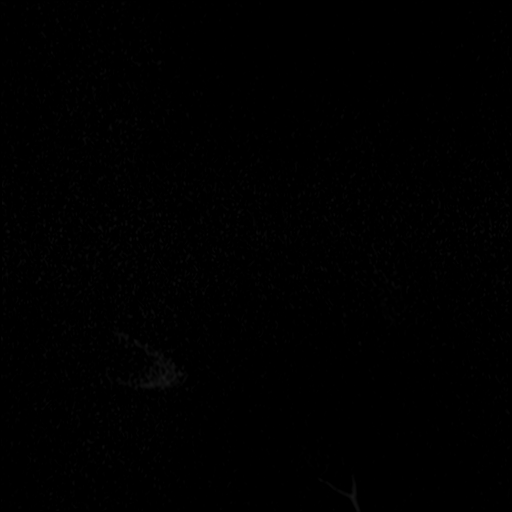
[im 5/25]
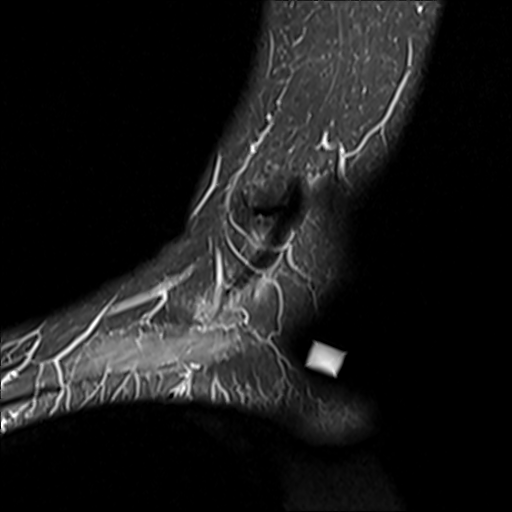
[im 10/25]
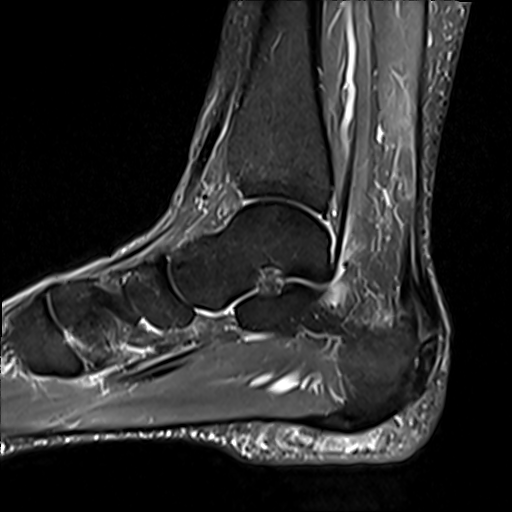
[im 15/25]
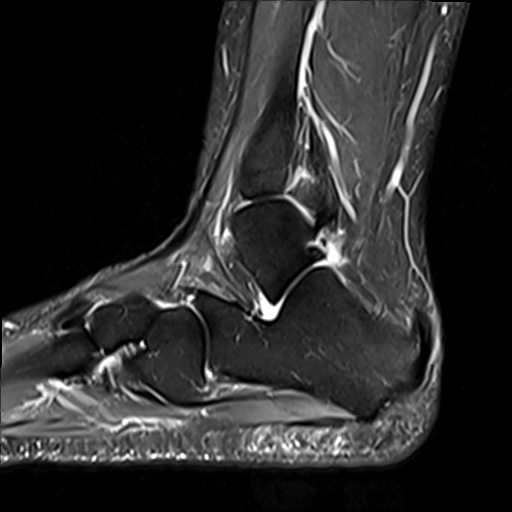
[im 20/25]
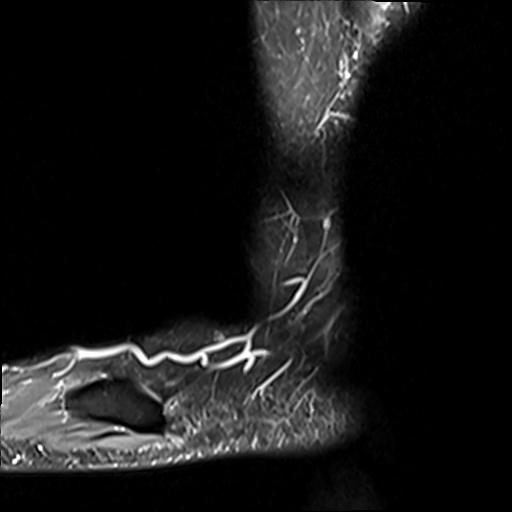

[37 of 40 positions shown; findings below may reference images not displayed]

FINDINGS: TENDONS

Peroneal: Peroneal longus tendon intact. Peroneal brevis intact.

Posteromedial: Posterior tibial tendon intact. Flexor digitorum
longus tendon intact. Flexor hallucis longus tendon intact.

Anterior: Tibialis anterior tendon intact. Extensor hallucis longus
tendon intact Extensor digitorum longus tendon intact.

Achilles: Evidence of prior Achilles tendon repair. There is
insertional Achilles tendinosis with a deep and outer surface
fraying and low-grade interstitial tearing. There is a a tiny
ossification near the more anterior insertion (sagittal T1 image 12,
and low T1 signal ossification along the more distal insertion, this
is better seen on comparison radiograph. No full-thickness or
retracted Achilles tendon tear.

Plantar Fascia: Intact.

LIGAMENTS

Lateral: Anterior talofibular ligament intact. Calcaneofibular
ligament intact. Posterior talofibular ligament intact. Anterior and
posterior tibiofibular ligaments intact.

Medial: Deltoid ligament intact. Spring ligament intact.

CARTILAGE

Ankle Joint: No significant joint effusion. Normal ankle mortise. No
chondral defect.

Subtalar Joints/Sinus Tarsi: Normal subtalar joints. No subtalar
joint effusion. Normal sinus tarsi.

Bones: No other marrow signal abnormality. No acute fracture or
dislocation.

Soft Tissue: No fluid collection or hematoma. Muscles are normal
without edema or atrophy. Tarsal tunnel is normal.
IMPRESSION: Prior Achilles tendon repair. There is insertional Achilles
tendinosis with deep in outer surface fraying and low-grade
interstitial tearing. No full-thickness or retracted Achilles tear.
Insertional Achilles enthesophytes/intratendinous ossification as
seen on comparison radiograph.

## 2023-02-17 ENCOUNTER — Ambulatory Visit: Payer: BC Managed Care – PPO

## 2023-02-17 ENCOUNTER — Ambulatory Visit: Payer: BC Managed Care – PPO | Admitting: Podiatry

## 2023-02-17 DIAGNOSIS — M79672 Pain in left foot: Secondary | ICD-10-CM | POA: Diagnosis not present

## 2023-02-17 DIAGNOSIS — M79671 Pain in right foot: Secondary | ICD-10-CM | POA: Diagnosis not present

## 2023-02-17 DIAGNOSIS — M84374A Stress fracture, right foot, initial encounter for fracture: Secondary | ICD-10-CM

## 2023-02-17 NOTE — Progress Notes (Signed)
  Subjective:  Patient ID: Brenda Lewis, female    DOB: November 09, 1965,  MRN: [id number]  Chief Complaint  Patient presents with   Foot Pain    Pt is here for right foot pain and swelling since last week there is more pain when she is wearing shoes she says that anything that touches the top of her foot hurts, She also states that she is still having swelling on bilateral heels since surgery for heel spur 4 years ago    56 y.o. female presents With concern for pain in her right dorsal lateral forefoot.  She says that she was on a trip in [place] all approximately 1 to 2 weeks ago.  She did a lot of walking on that trip.  She states she is now having significant pain when she walks on the right lateral forefoot.  Pain with any pressure pressing on the top of the the bone behind the fourth toe.  Past Medical History:  Diagnosis Date   Asthma    Vertigo     Allergies  Allergen Reactions   Gadolinium Derivatives Anaphylaxis, Shortness Of Breath and Hives   Contrast Media [Iodinated Contrast Media]    Codeine Other (See Comments)    ROS: Negative except as per HPI above  Objective:  General: AAO x3, NAD  Dermatological: With inspection and palpation of the right and left lower extremities there are no open sores, no preulcerative lesions, no rash or signs of infection present. Nails are of normal length thickness and coloration.   Vascular:  Dorsalis Pedis artery and Posterior Tibial artery pedal pulses are 2/4 bilateral.  Capillary fill time < 3 sec to all digits.   Neruologic: Grossly intact via light touch bilateral. Protective threshold intact to all sites bilateral.   Musculoskeletal: Mild noted about the right lateral forefoot.  Significant pain with palpation along the course of the fourth metatarsal head and neck area.  Gait: Unassisted, Nonantalgic.   No images are attached to the encounter.  Radiographs:  Date: 02/17/2023 XR right foot weightbearing AP/Lateral/Oblique    Findings: Attention directed to the fourth metatarsal head there is possible mild nondisplaced radiolucency that is incomplete near the fourth metatarsal head and neck junction.  This is best seen on oblique view.  Concern for possible stress fracture. Assessment:   1. Stress fracture of right foot, initial encounter   2. Foot pain, bilateral      Plan:  Patient was evaluated and treated and all questions answered.  # Stress fracture of right foot fourth metatarsal neck -Discussed with the patient she does have evidence of stress fracture in her right fourth metatarsal neck area -I recommend immobilization in cam boot for the next 4 weeks. -Recommend she limit her weightbearing time and activity is much as she is able to -Anti-inflammatory or Tylenol for pain control -Recommend high-dose vitamin D and calcium supplementation -Will have her follow-up in 1 month for repeat x-rays if no improvement consider MRI         Corinna Gab, DPM [facility] / [facility]

## 2023-02-20 ENCOUNTER — Other Ambulatory Visit: Payer: Self-pay | Admitting: Podiatry

## 2023-02-20 ENCOUNTER — Ambulatory Visit: Payer: BC Managed Care – PPO | Admitting: Podiatry

## 2023-02-20 DIAGNOSIS — M79671 Pain in right foot: Secondary | ICD-10-CM

## 2023-02-20 DIAGNOSIS — M84374A Stress fracture, right foot, initial encounter for fracture: Secondary | ICD-10-CM

## 2023-03-17 ENCOUNTER — Ambulatory Visit: Payer: BC Managed Care – PPO

## 2023-03-17 ENCOUNTER — Ambulatory Visit: Payer: BC Managed Care – PPO | Admitting: Podiatry

## 2023-03-17 DIAGNOSIS — M84374A Stress fracture, right foot, initial encounter for fracture: Secondary | ICD-10-CM | POA: Diagnosis not present

## 2023-03-17 DIAGNOSIS — M84374G Stress fracture, right foot, subsequent encounter for fracture with delayed healing: Secondary | ICD-10-CM | POA: Diagnosis not present

## 2023-03-17 NOTE — Progress Notes (Signed)
  Subjective:  Patient ID: Brenda Lewis, female    DOB: 08/17/1965,  MRN: [id number]  Chief Complaint  Patient presents with   Follow-up    f/u right foot stress fx    57 y.o. female presents for follow-up of right foot metatarsal stress fracture to the fourth metatarsal neck.  She has been immobilized in a cam boot for the past 4 weeks.  She reports she still has a pain in the right foot when she walks out of the boot however in the boot feels pretty good.  She notices the swelling has decreased from prior.  Past Medical History:  Diagnosis Date   Asthma    Vertigo     Allergies  Allergen Reactions   Gadolinium Derivatives Anaphylaxis, Shortness Of Breath and Hives   Contrast Media [Iodinated Contrast Media]    Codeine Other (See Comments)    ROS: Negative except as per HPI above  Objective:  General: AAO x3, NAD  Dermatological: With inspection and palpation of the right and left lower extremities there are no open sores, no preulcerative lesions, no rash or signs of infection present. Nails are of normal length thickness and coloration.   Vascular:  Dorsalis Pedis artery and Posterior Tibial artery pedal pulses are 2/4 bilateral.  Capillary fill time < 3 sec to all digits.   Neruologic: Grossly intact via light touch bilateral. Protective threshold intact to all sites bilateral.   Musculoskeletal: No edema noted about the right lateral forefoot.  Still with moderate pain with palpation along the course of the fourth metatarsal head and neck area.  Gait: Unassisted, Nonantalgic.   No images are attached to the encounter.  Radiographs:  Date: 03/17/2023 XR right foot weightbearing AP/Lateral/Oblique   Findings: Attention directed to the fourth metatarsal head there is possible mild nondisplaced radiolucency that is incomplete near the fourth metatarsal head and neck junction.  This is best seen on oblique view and not changed much if at all from prior no evidence of  significant bone callus formation noted Assessment:   1. Stress fracture of right foot with delayed healing, subsequent encounter      Plan:  Patient was evaluated and treated and all questions answered.  # Stress fracture of right foot fourth metatarsal neck -Overall possibly slightly improved or stable from prior.  No evidence of significant bone callus formation however she does have decreased pain when in the boot -I recommend immobilization in cam boot for the next 4 weeks. -Recommend she limit her weightbearing time and activity is much as she is able to -Anti-inflammatory or Tylenol for pain control -Recommend high-dose vitamin D and calcium supplementation -Will have her follow-up in 1 month for repeat x-rays if no improvement consider MRI         Corinna Gab, DPM [facility] / [facility]

## 2023-03-29 ENCOUNTER — Encounter: Payer: Self-pay | Admitting: *Deleted

## 2023-04-03 DIAGNOSIS — R9431 Abnormal electrocardiogram [ECG] [EKG]: Secondary | ICD-10-CM | POA: Insufficient documentation

## 2023-04-03 DIAGNOSIS — R079 Chest pain, unspecified: Secondary | ICD-10-CM | POA: Insufficient documentation

## 2023-04-03 DIAGNOSIS — J45909 Unspecified asthma, uncomplicated: Secondary | ICD-10-CM | POA: Insufficient documentation

## 2023-04-03 DIAGNOSIS — E785 Hyperlipidemia, unspecified: Secondary | ICD-10-CM | POA: Insufficient documentation

## 2023-04-03 DIAGNOSIS — F430 Acute stress reaction: Secondary | ICD-10-CM | POA: Insufficient documentation

## 2023-04-03 DIAGNOSIS — H918X1 Other specified hearing loss, right ear: Secondary | ICD-10-CM | POA: Insufficient documentation

## 2023-04-03 DIAGNOSIS — R42 Dizziness and giddiness: Secondary | ICD-10-CM | POA: Insufficient documentation

## 2023-04-03 DIAGNOSIS — F419 Anxiety disorder, unspecified: Secondary | ICD-10-CM | POA: Insufficient documentation

## 2023-04-04 ENCOUNTER — Ambulatory Visit: Payer: BC Managed Care – PPO

## 2023-04-04 VITALS — BP 106/66 | HR 98 | Ht 65.0 in | Wt 163.2 lb

## 2023-04-04 DIAGNOSIS — R9431 Abnormal electrocardiogram [ECG] [EKG]: Secondary | ICD-10-CM | POA: Diagnosis not present

## 2023-04-04 DIAGNOSIS — R072 Precordial pain: Secondary | ICD-10-CM

## 2023-04-04 DIAGNOSIS — R0609 Other forms of dyspnea: Secondary | ICD-10-CM

## 2023-04-04 DIAGNOSIS — E7801 Familial hypercholesterolemia: Secondary | ICD-10-CM | POA: Diagnosis not present

## 2023-04-04 MED ORDER — ASPIRIN 81 MG PO TBEC: 81.0000 mg | DELAYED_RELEASE_TABLET | Freq: Every day | ORAL | Status: AC

## 2023-04-04 MED ORDER — ROSUVASTATIN CALCIUM 5 MG PO TABS: 5.0000 mg | ORAL_TABLET | Freq: Every day | ORAL | 3 refills | Status: AC

## 2023-04-04 MED ORDER — METOPROLOL TARTRATE 50 MG PO TABS: 50.0000 mg | ORAL_TABLET | ORAL | 0 refills | Status: DC

## 2023-04-04 MED ORDER — PREDNISONE 50 MG PO TABS: ORAL_TABLET | ORAL | 0 refills | Status: DC

## 2023-04-04 NOTE — Assessment & Plan Note (Signed)
Nonspecific findings. Given her symptoms of chest pain and dyspnea as reviewed, will further assess for coronary artery disease with a CTA coronary angiogram.

## 2023-04-04 NOTE — Progress Notes (Signed)
Cardiology Consultation:    Date:  04/04/2023   ID:  [name], DOB Jul 10, 1966, MRN [id number]  PCP:  Brenda Emery, NP  Cardiologist:  Marlyn Corporal Shyhiem Beeney, MD   Referring MD: Brenda Emery, NP   No chief complaint on file. Chest pain and atypical palpitations   ASSESSMENT AND PLAN:   57 year old woman with history of anxiety, asthma, hyperlipidemia, vertigo, family history of hypercoagulability with DVT and PE history presenting with typical symptoms of chest discomfort and shortness of breath with exertion.  She does not have any obvious signs of lower extremity DVT.  Symptoms have been somewhat chronic for the past 3 or more months.  Problem List Items Addressed This Visit     Abnormal electrocardiogram - Primary    Nonspecific findings. Given her symptoms of chest pain and dyspnea as reviewed, will further assess for coronary artery disease with a CTA coronary angiogram.      Relevant Orders   EKG 12-Lead (Completed)   Chest pain, unspecified    Chest pain associated with dyspnea on exertion she describes could be of cardiac origin.  However she also has known exertional component. her symptoms are very atypical. Differential includes angina versus pulmonary causes such as chronic venous thromboembolism. However she does not have any acute symptoms of decompensation, there are no signs to suggest acute lower extremity DVT, despite her risk factors.  We discussed further evaluation with transthoracic echocardiogram. We discussed further evaluation with CT coronary angiogram, with premedication using steroids, Benadryl and if needed famotidine or Pepcid given her history of dye allergy.  However her allergy seems more related to MRI contrast that was used back in 2020.  Will prescribe metoprolol to tartrate 50 mg on the night before and on the morning of the test to help optimize heart rates for the CT coronary angiogram.  Hopefully the CT coronary angiogram  will also give [place] the preliminary assessment for proximal pulmonary artery branches to rule out any major embolism concerns.  Advised her to start taking aspirin 81 mg once daily. Also reviewed benefits of statins and potential risk. For now she is agreeable to start statins, suggest rosuvastatin 5 mg once daily.  If she does not have any significant coronary artery disease she can discontinue statins and continue with her lifestyle and dietary modification for lipid lowering and further follow-up with PCP for long-term management.      Relevant Orders   CT CORONARY MORPH W/CTA COR W/SCORE W/CA W/CM &/OR WO/CM   Hyperlipidemia    For now she is agreeable to start statins, suggest rosuvastatin 5 mg once daily.  If she does not have any significant coronary artery disease she can discontinue statins and continue with her lifestyle and dietary modification for lipid lowering and further follow-up with PCP for long-term management.      Relevant Medications   aspirin EC 81 MG tablet   rosuvastatin (CRESTOR) 5 MG tablet   metoprolol tartrate (LOPRESSOR) 50 MG tablet   Other Visit Diagnoses     Dyspnea on exertion       Relevant Orders   ECHOCARDIOGRAM COMPLETE      Return to clinic based on test results, we will tentatively schedule for 1 month follow-up.   History of Present Illness:    Brenda Lewis is a 57 y.o. female who is being seen today for the evaluation of chest pain and heart racing at the request of Brenda Emery, NP.   Has  history of anxiety, asthma, hyperlipidemia, vertigo.   Family history of clotting disorder with her son diagnosed with PE and DVT, her mother and grandmother with history of DVTs.  She herself never had any DVTs.  Lower extremity Achillis tendon surgeries in 2022.  Ever since then she has been having ankle pain and nonpitting edema at times and report overall reduced physical activity.  Recent visit with PCP 03/17/2023. Reportedly chest  pain with exertion.  Referred here for further evaluation and also reported history of heart racing.  She recently in July sustained a stress fracture of the right foot and following up with [facility] for management.  Currently recommended to have a cam boot on for immobilization of the foot while ambulating.  However she has not been using it regularly.  Mentions over the last few months, preceding the stress fracture of the foot, she has been having symptoms of chest pressure that comes on with exertion, associated with shortness of breath and at times at rest that lasts for few seconds to minutes.  No syncopal episodes.  No orthopnea, paroxysmal nocturnal dyspnea.  No pitting pedal edema.  EKG in the clinic today shows sinus rhythm with heart rate 98/min, nonspecific T wave abnormality in anteroseptal leads. Prior EKG from 03/17/2023 at PCPs office appears similar.  Another prior EKG from September 02, 2022 appears similar.  Blood work from March 17, 2023 sodium 140, potassium 4.7, BUN 18, creatinine 0.88, normal transaminases Lipid panel total cholesterol 285, triglycerides 177, LDL 180, HDL 70 Hemoglobin 15.1, hematocrit 45.5, platelets 349 Hemoglobin A1c 5.5  5-day event monitor report [from 03/17/2023 through 03/23/2023] scanned reviewed, average heart rate 85/min ranging from 61 bpm to 138 bpm.  Rare PVCs less than 1%,  no PACs less than 1% Tracings reviewed myself. She mentions symptoms during the monitoring duration were not captured as she had the device off for battery changes.  Allergy to MRI contrast [her chart notes this has iodinated contrast use; reportedly MRI scan for the hand for evaluating Dupuytren's contracture in 2020 with use of contrast resulted in allergic reaction with airway swelling].  Compliant with medications.  She however did not want to get started on statins as prescribed by her PCP and wants to attempt dietary modifications to lower her cholesterol  numbers.   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   Hyperlipidemia    Low back pain 12/30/2021   Neck pain 12/30/2021   Osteoarthritis of carpometacarpal (CMC) joint of thumb 03/02/2019   Other specified hearing loss, right ear    Pain of left hand 01/02/2019   Right wrist pain 01/02/2019   Stenosing tenosynovitis 01/02/2019   Vertigo     Past Surgical History:  Procedure Laterality Date   ADENOIDECTOMY     APPENDECTOMY  08/1987   CHOLECYSTECTOMY     EYE SURGERY Bilateral    Laser eye surgery   FOOT SURGERY     KNEE SURGERY Right 1994   TONSILLECTOMY      Current Medications: Current Meds  Medication Sig   albuterol (VENTOLIN HFA) 108 (90 Base) MCG/ACT inhaler Inhale 2 puffs into the lungs every 6 (six) hours as needed for wheezing or shortness of breath.   ALPRAZolam (XANAX) 0.25 MG tablet  TAKE 1 TAB(S) ORALLY 2 TIMES A DAY FOR 7 DAY(S) AS NEEDED   aspirin EC 81 MG tablet Take 1 tablet (81 mg total) by mouth daily. Swallow whole.   meclizine (ANTIVERT) 25 MG tablet Take 25 mg by mouth 3 (three) times daily as needed for dizziness.   metoprolol tartrate (LOPRESSOR) 50 MG tablet Take 1 tablet (50 mg total) by mouth as directed. Take 1 tablet at bedtime the night before the CT Scan and 1 tablet the morning of the CT Scan.   predniSONE (DELTASONE) 50 MG tablet 1. Prednisone 50 mg - take 13 hours prior to test 2. Take another Prednisone 50 mg 7 hours prior to test 3. Take another Prednisone 50 mg 1 hour prior to test 4. Take Benadryl 50 mg 1 hour prior to test   rosuvastatin (CRESTOR) 5 MG tablet Take 1 tablet (5 mg total) by mouth daily.   SKYRIZI PEN 150 MG/ML SOAJ Inject 150 mg into the skin every 3 (three) months.     Allergies:   Gadolinium derivatives, Bee venom, Contrast  media [iodinated contrast media], and Codeine   Social History   Socioeconomic History   Marital status: Married    Spouse name: Not on file   Number of children: Not on file   Years of education: Not on file   Highest education level: Not on file  Occupational History   Not on file  Tobacco Use   Smoking status: Never   Smokeless tobacco: Never  Vaping Use   Vaping status: Never Used  Substance and Sexual Activity   Alcohol use: Not Currently    Comment: occasionally   Drug use: Never   Sexual activity: Not on file  Other Topics Concern   Not on file  Social History Narrative   Not on file   Social Determinants of Health   Financial Resource Strain: Not on file  Food Insecurity: Not on file  Transportation Needs: Not on file  Physical Activity: Not on file  Stress: Not on file  Social Connections: Not on file     Family History: The patient's family history includes Rheum arthritis in her father. ROS:   Please see the history of present illness.    All 14 point review of systems negative except as described per history of present illness.  EKGs/Labs/Other Studies Reviewed:    The following studies were reviewed today:   EKG:  EKG Interpretation Date/Time:  Tuesday April 04 2023 15:47:34 EDT Ventricular Rate:  98 PR Interval:  146 QRS Duration:  84 QT Interval:  356 QTC Calculation: 454 R Axis:   -2  Text Interpretation: Normal sinus rhythm Possible Left atrial enlargement Nonspecific T wave abnormality Abnormal ECG No previous ECGs available Confirmed by Huntley Dec reddy [phone]) on 04/04/2023 4:13:55 PM    Recent Labs: No results found for requested labs within last 365 days.  Recent Lipid Panel No results found for: "CHOL", "TRIG", "HDL", "CHOLHDL", "VLDL", "LDLCALC", "LDLDIRECT"  Physical Exam:    VS:  BP 106/66   Pulse 98   Ht 5\' 5"  (1.651 m)   Wt 163 lb 3.2 oz (74 kg)   SpO2 96%   BMI 27.16 kg/m     Wt Readings from Last 3  Encounters:  04/04/23 163 lb 3.2 oz (74 kg)  03/17/23 160 lb (72.6 kg)  05/06/20 149 lb 12.8 oz (67.9 kg)     GENERAL:  Well nourished, well developed in no acute distress NECK: No JVD; No  carotid bruits CARDIAC: RRR, S1 and S2 present, no murmurs, no rubs, no gallops CHEST:  Clear to auscultation without rales, wheezing or rhonchi  Extremities: No pitting pedal edema. Pulses bilaterally symmetric with radial 2+ and dorsalis pedis 2+. no calf tenderness. NEUROLOGIC:  Alert and oriented x 3  Medication Adjustments/Labs and Tests Ordered: Current medicines are reviewed at length with the patient today.  Concerns regarding medicines are outlined above.  Orders Placed This Encounter  Procedures   CT CORONARY MORPH W/CTA COR W/SCORE W/CA W/CM &/OR WO/CM   EKG 12-Lead   ECHOCARDIOGRAM COMPLETE   Meds ordered this encounter  Medications   aspirin EC 81 MG tablet    Sig: Take 1 tablet (81 mg total) by mouth daily. Swallow whole.   rosuvastatin (CRESTOR) 5 MG tablet    Sig: Take 1 tablet (5 mg total) by mouth daily.    Dispense:  90 tablet    Refill:  3   predniSONE (DELTASONE) 50 MG tablet    Sig: 1. Prednisone 50 mg - take 13 hours prior to test 2. Take another Prednisone 50 mg 7 hours prior to test 3. Take another Prednisone 50 mg 1 hour prior to test 4. Take Benadryl 50 mg 1 hour prior to test    Dispense:  3 tablet    Refill:  0   metoprolol tartrate (LOPRESSOR) 50 MG tablet    Sig: Take 1 tablet (50 mg total) by mouth as directed. Take 1 tablet at bedtime the night before the CT Scan and 1 tablet the morning of the CT Scan.    Dispense:  2 tablet    Refill:  0    Signed, Kanyia Heaslip reddy Brena Windsor, MD, MPH, [facility]. 04/04/2023 4:47 PM    [facility]

## 2023-04-04 NOTE — Assessment & Plan Note (Signed)
For now she is agreeable to start statins, suggest rosuvastatin 5 mg once daily.  If she does not have any significant coronary artery disease she can discontinue statins and continue with her lifestyle and dietary modification for lipid lowering and further follow-up with PCP for long-term management.

## 2023-04-04 NOTE — Assessment & Plan Note (Signed)
Chest pain associated with dyspnea on exertion she describes could be of cardiac origin.  However she also has known exertional component. her symptoms are very atypical. Differential includes angina versus pulmonary causes such as chronic venous thromboembolism. However she does not have any acute symptoms of decompensation, there are no signs to suggest acute lower extremity DVT, despite her risk factors.  We discussed further evaluation with transthoracic echocardiogram. We discussed further evaluation with CT coronary angiogram, with premedication using steroids, Benadryl and if needed famotidine or Pepcid given her history of dye allergy.  However her allergy seems more related to MRI contrast that was used back in 2020.  Will prescribe metoprolol to tartrate 50 mg on the night before and on the morning of the test to help optimize heart rates for the CT coronary angiogram.  Hopefully the CT coronary angiogram will also give [place] the preliminary assessment for proximal pulmonary artery branches to rule out any major embolism concerns.  Advised her to start taking aspirin 81 mg once daily. Also reviewed benefits of statins and potential risk. For now she is agreeable to start statins, suggest rosuvastatin 5 mg once daily.  If she does not have any significant coronary artery disease she can discontinue statins and continue with her lifestyle and dietary modification for lipid lowering and further follow-up with PCP for long-term management.

## 2023-04-04 NOTE — Patient Instructions (Addendum)
Medication Instructions:    TAKE: Metoprolol Tartrate 50mg  1 tablet at bedtime the night before the test and 1 tablet in the morning of the CT Scan  START: Aspirin 81mg  1 tablet daily  START: Crestor 5mg  1 tablet daily   Lab Work: None If you have labs (blood work) drawn today and your tests are completely normal, you will receive your results only by: [employer] (if you have MyChart) OR A paper copy in the mail If you have any lab test that is abnormal or we need to change your treatment, we will call you to review the results.   Testing/Procedures:  Your physician has requested that you have an echocardiogram. Echocardiography is a painless test that uses sound waves to create images of your heart. It provides your doctor with information about the size and shape of your heart and how well your heart's chambers and valves are working. This procedure takes approximately one hour. There are no restrictions for this procedure. Please do NOT wear cologne, perfume, aftershave, or lotions (deodorant is allowed). Please arrive 15 minutes prior to your appointment time.      Your cardiac CT will be scheduled at one of the below locations:   [facility] [address] [place], [place] [address] [phone]    If scheduled at [facility], please arrive at the [facility] and Children's Entrance (Entrance C2) of [facility] 30 minutes prior to test start time. You can use the FREE valet parking offered at entrance C (encouraged to control the heart rate for the test)  Proceed to the [facility] Radiology Department (first floor) to check-in and test prep.  All radiology patients and guests should use entrance C2 at [facility], accessed from [facility], even though the hospital's physical address listed is [address]      Please follow these instructions carefully (unless otherwise directed):    On the Night  Before the Test: Be sure to Drink plenty of water. Do not consume any caffeinated/decaffeinated beverages or chocolate 12 hours prior to your test. Do not take any antihistamines 12 hours prior to your test. If the patient has contrast allergy: Patient will need a prescription for Prednisone and very clear instructions (as follows): Prednisone 50 mg - take 13 hours prior to test Take another Prednisone 50 mg 7 hours prior to test Take another Prednisone 50 mg 1 hour prior to test Take Benadryl 50 mg 1 hour prior to test Patient must complete all four doses of above prophylactic medications. Patient will need a ride after test due to Benadryl.  On the Day of the Test: Drink plenty of water until 1 hour prior to the test. Do not eat any food 4 hours prior to the test. You may take your regular medications prior to the test.  Take metoprolol (Lopressor) as directed FEMALES- please wear underwire-free bra if available, avoid dresses & tight clothing       After the Test: Drink plenty of water. After receiving IV contrast, you may experience a mild flushed feeling. This is normal. On occasion, you may experience a mild rash up to 24 hours after the test. This is not dangerous. If this occurs, you can take Benadryl 25 mg and increase your fluid intake. If you experience trouble breathing, this can be serious. If it is severe call 911 IMMEDIATELY. If it is mild, please call our office. If you take any of these medications: Glipizide/Metformin,  Avandament, Glucavance, please do not take 48 hours after completing test unless otherwise instructed.  We will call to schedule your test 2-4 weeks out understanding that some insurance companies will need an authorization prior to the service being performed.   For non-scheduling related questions, please contact the cardiac imaging nurse navigator should you have any questions/concerns: Rockwell Alexandria, Cardiac Imaging Nurse Navigator Larey Brick,  Cardiac Imaging Nurse Navigator [facility] Direct Office Dial: [phone]   For scheduling needs, including cancellations and rescheduling, please call [place], [phone].    Follow-Up: At [facility], you and your health needs are our priority.  As part of our continuing mission to provide you with exceptional heart care, we have created designated Provider Care Teams.  These Care Teams include your primary Cardiologist (physician) and Advanced Practice Providers (APPs -  Physician Assistants and Nurse Practitioners) who all work together to provide you with the care you need, when you need it.  We recommend signing up for the patient portal called "MyChart".  Sign up information is provided on this After Visit Summary.  MyChart is used to connect with patients for Virtual Visits (Telemedicine).  Patients are able to view lab/test results, encounter notes, upcoming appointments, etc.  Non-urgent messages can be sent to your provider as well.   To learn more about what you can do with MyChart, go to [email or web address].    Your next appointment:   1 month(s)  The format for your next appointment:   In Person  Provider:   Huntley Dec, MD    Other Instructions NA

## 2023-04-12 ENCOUNTER — Encounter (HOSPITAL_COMMUNITY): Payer: Self-pay

## 2023-04-12 ENCOUNTER — Telehealth (HOSPITAL_COMMUNITY): Payer: Self-pay | Admitting: *Deleted

## 2023-04-12 NOTE — Telephone Encounter (Signed)
Reaching out to patient to offer assistance regarding upcoming cardiac imaging study; pt verbalizes understanding of appt date/time, parking situation and where to check in, pre-test NPO status and medications ordered, and verified current allergies; name and call back number provided for further questions should they arise Johney Frame RN Navigator Cardiac Imaging [facility] [phone] office [phone] cell  Mychart instructions sent per patient request.

## 2023-04-13 ENCOUNTER — Ambulatory Visit (HOSPITAL_COMMUNITY)
Admission: RE | Admit: 2023-04-13 | Discharge: 2023-04-13 | Disposition: A | Discharge status: Home / Self Care | Payer: BC Managed Care – PPO | Source: Ambulatory Visit

## 2023-04-13 DIAGNOSIS — R072 Precordial pain: Secondary | ICD-10-CM | POA: Insufficient documentation

## 2023-04-13 MED ORDER — METOPROLOL TARTRATE 5 MG/5ML IV SOLN
INTRAVENOUS | Status: AC
  Filled 2023-04-13: qty 5

## 2023-04-13 MED ORDER — NITROGLYCERIN 0.4 MG SL SUBL
0.8000 mg | SUBLINGUAL_TABLET | Freq: Once | SUBLINGUAL | Status: AC
  Administered 2023-04-13: 0.8 mg via SUBLINGUAL

## 2023-04-13 MED ORDER — IOHEXOL 350 MG/ML SOLN
100.0000 mL | Freq: Once | INTRAVENOUS | Status: AC | PRN
  Administered 2023-04-13: 100 mL via INTRAVENOUS

## 2023-04-13 MED ORDER — METOPROLOL TARTRATE 5 MG/5ML IV SOLN: 5.0000 mg | INTRAVENOUS | Status: DC | PRN

## 2023-04-13 MED ORDER — NITROGLYCERIN 0.4 MG SL SUBL
SUBLINGUAL_TABLET | SUBLINGUAL | Status: AC
  Filled 2023-04-13: qty 2

## 2023-04-13 NOTE — Progress Notes (Signed)
Pt came in for CT scan with contrast, known rxn to contrast. Per pt, took all medications for 13 hour prep.  Pt arrived stating she is feeling anxious. After first scan with contrast, pt c/o difficulty swallowing, dryness and tightness in the throat that subsided with some water and deep breathing. First scan unsuccessful, second scan completed with contrast. After second scan with contrast, pt stated return of dryness/tightness in throat and difficulty swallowing. Began endorsing chest tightness, stating "my heart is racing". C/o lightheaded on transfer to wheelchair. Took home inhaler and reported some improvement in symptoms. VS taken, within normal limits. Pt brought back to nurses station for further monitoring. VS remain within normal limits on recheck. NP Alwyn Ren at bedside, will continue to monitor for 30-45 minutes longer to ensure symptoms have resolved. All questions answered by this RN and call bell within reach. At 1005, pt stated all symptoms are resolved and is comfortable going home. Able to ambulate without assistance or dizziness.

## 2023-04-13 NOTE — Progress Notes (Signed)
Post contrast, pt reporting symptoms of chest tightness, throat dryness/tightness, difficulty swallowing, "heart racing", and lightheaded on ambulation. Taken back to nurses station once stabilized. See note.

## 2023-04-13 NOTE — Progress Notes (Addendum)
NP Alwyn Ren at bedside, pt reporting improvement in symptoms. Per NP, will monitor pt for 30-45 minutes to ensure symptoms have resolved. See note.

## 2023-04-14 ENCOUNTER — Ambulatory Visit: Payer: BC Managed Care – PPO | Admitting: Podiatry

## 2023-04-14 ENCOUNTER — Other Ambulatory Visit: Payer: Self-pay | Admitting: Medical Genetics

## 2023-04-14 ENCOUNTER — Ambulatory Visit (INDEPENDENT_AMBULATORY_CARE_PROVIDER_SITE_OTHER): Payer: BC Managed Care – PPO

## 2023-04-14 DIAGNOSIS — M84374G Stress fracture, right foot, subsequent encounter for fracture with delayed healing: Secondary | ICD-10-CM

## 2023-04-14 DIAGNOSIS — Z006 Encounter for examination for normal comparison and control in clinical research program: Secondary | ICD-10-CM

## 2023-04-14 DIAGNOSIS — M7661 Achilles tendinitis, right leg: Secondary | ICD-10-CM | POA: Diagnosis not present

## 2023-04-14 DIAGNOSIS — M6780 Other specified disorders of synovium and tendon, unspecified site: Secondary | ICD-10-CM

## 2023-04-14 MED ORDER — METHYLPREDNISOLONE 4 MG PO TBPK: ORAL_TABLET | ORAL | 0 refills | Status: AC

## 2023-04-14 NOTE — Patient Instructions (Signed)
Achilles Tendinitis  with Rehab Achilles tendinitis is a disorder of the Achilles tendon. The Achilles tendon connects the large calf muscles (Gastrocnemius and Soleus) to the heel bone (calcaneus). This tendon is sometimes called the heel cord. It is important for pushing-off and standing on your toes and is important for walking, running, or jumping. Tendinitis is often caused by overuse and repetitive microtrauma. SYMPTOMS  Pain, tenderness, swelling, warmth, and redness may occur over the Achilles tendon even at rest.  Pain with pushing off, or flexing or extending the ankle.  Pain that is worsened after or during activity. CAUSES   Overuse sometimes seen with rapid increase in exercise programs or in sports requiring running and jumping.  Poor physical conditioning (strength and flexibility or endurance).  Running sports, especially training running down hills.  Inadequate warm-up before practice or play or failure to stretch before participation.  Injury to the tendon. PREVENTION   Warm up and stretch before practice or competition.  Allow time for adequate rest and recovery between practices and competition.  Keep up conditioning.  Keep up ankle and leg flexibility.  Improve or keep muscle strength and endurance.  Improve cardiovascular fitness.  Use proper technique.  Use proper equipment (shoes, skates).  To help prevent recurrence, taping, protective strapping, or an adhesive bandage may be recommended for several weeks after healing is complete. PROGNOSIS   Recovery may take weeks to several months to heal.  Longer recovery is expected if symptoms have been prolonged.  Recovery is usually quicker if the inflammation is due to a direct blow as compared with overuse or sudden strain. RELATED COMPLICATIONS   Healing time will be prolonged if the condition is not correctly treated. The injury must be given plenty of time to heal.  Symptoms can reoccur if  activity is resumed too soon.  Untreated, tendinitis may increase the risk of tendon rupture requiring additional time for recovery and possibly surgery. TREATMENT   The first treatment consists of rest anti-inflammatory medication, and ice to relieve the pain.  Stretching and strengthening exercises after resolution of pain will likely help reduce the risk of recurrence. Referral to a physical therapist or athletic trainer for further evaluation and treatment may be helpful.  A walking boot or cast may be recommended to rest the Achilles tendon. This can help break the cycle of inflammation and microtrauma.  Arch supports (orthotics) may be prescribed or recommended by your caregiver as an adjunct to therapy and rest.  Surgery to remove the inflamed tendon lining or degenerated tendon tissue is rarely necessary and has shown less than predictable results. MEDICATION   Nonsteroidal anti-inflammatory medications, such as aspirin and ibuprofen, may be used for pain and inflammation relief. Do not take within 7 days before surgery. Take these as directed by your caregiver. Contact your caregiver immediately if any bleeding, stomach upset, or signs of allergic reaction occur. Other minor pain relievers, such as acetaminophen, may also be used.  Pain relievers may be prescribed as necessary by your caregiver. Do not take prescription pain medication for longer than 4 to 7 days. Use only as directed and only as much as you need. HEAT AND COLD  Cold is used to relieve pain and reduce inflammation for acute and chronic Achilles tendinitis. Cold should be applied for 10 to 15 minutes every 2 to 3 hours for inflammation and pain and immediately after any activity that aggravates your symptoms. Use ice packs or an ice massage.  Heat may be used   before performing stretching and strengthening activities prescribed by your caregiver. Use a heat pack or a warm soak. SEEK MEDICAL CARE IF:  Symptoms get  worse or do not improve in 2 weeks despite treatment.  New, unexplained symptoms develop. Drugs used in treatment may produce side effects.   EXERCISES-- hold each stretch for 30 seconds and repeat 10 times.  Complete each stretch 3 times per day.   RANGE OF MOTION (ROM) AND STRETCHING EXERCISES - Achilles Tendinitis  These exercises may help you when beginning to rehabilitate your injury. Your symptoms may resolve with or without further involvement from your physician, physical therapist or athletic trainer. While completing these exercises, remember:   Restoring tissue flexibility helps normal motion to return to the joints. This allows healthier, less painful movement and activity.  An effective stretch should be held for at least 30 seconds.  A stretch should never be painful. You should only feel a gentle lengthening or release in the stretched tissue. STRETCH  Gastroc, Standing   Place hands on wall.  Extend right / left leg, keeping the front knee somewhat bent.  Slightly point your toes inward on your back foot.  Keeping your right / left heel on the floor and your knee straight, shift your weight toward the wall, not allowing your back to arch.  You should feel a gentle stretch in the right / left calf. Hold this position for __________ seconds. Repeat __________ times. Complete this stretch __________ times per day. STRETCH  Soleus, Standing   Place hands on wall.  Extend right / left leg, keeping the other knee somewhat bent.  Slightly point your toes inward on your back foot.  Keep your right / left heel on the floor, bend your back knee, and slightly shift your weight over the back leg so that you feel a gentle stretch deep in your back calf.  Hold this position for __________ seconds. Repeat __________ times. Complete this stretch __________ times per day. STRETCH  Gastrocsoleus, Standing  Note: This exercise can place a lot of stress on your foot and ankle.  Please complete this exercise only if specifically instructed by your caregiver.   Place the ball of your right / left foot on a step, keeping your other foot firmly on the same step.  Hold on to the wall or a rail for balance.  Slowly lift your other foot, allowing your body weight to press your heel down over the edge of the step.  You should feel a stretch in your right / left calf.  Hold this position for __________ seconds.  Repeat this exercise with a slight bend in your knee. Repeat __________ times. Complete this stretch __________ times per day.  STRENGTHENING EXERCISES - Achilles Tendinitis These exercises may help you when beginning to rehabilitate your injury. They may resolve your symptoms with or without further involvement from your physician, physical therapist or athletic trainer. While completing these exercises, remember:   Muscles can gain both the endurance and the strength needed for everyday activities through controlled exercises.  Complete these exercises as instructed by your physician, physical therapist or athletic trainer. Progress the resistance and repetitions only as guided.  You may experience muscle soreness or fatigue, but the pain or discomfort you are trying to eliminate should never worsen during these exercises. If this pain does worsen, stop and make certain you are following the directions exactly. If the pain is still present after adjustments, discontinue the exercise until you can discuss   the trouble with your clinician. STRENGTH - Plantar-flexors   Sit with your right / left leg extended. Holding onto both ends of a rubber exercise band/tubing, loop it around the ball of your foot. Keep a slight tension in the band.  Slowly push your toes away from you, pointing them downward.  Hold this position for __________ seconds. Return slowly, controlling the tension in the band/tubing. Repeat __________ times. Complete this exercise __________ times  per day.  STRENGTH - Plantar-flexors   Stand with your feet shoulder width apart. Steady yourself with a wall or table using as little support as needed.  Keeping your weight evenly spread over the width of your feet, rise up on your toes.*  Hold this position for __________ seconds. Repeat __________ times. Complete this exercise __________ times per day.  *If this is too easy, shift your weight toward your right / left leg until you feel challenged. Ultimately, you may be asked to do this exercise with your right / left foot only. STRENGTH  Plantar-flexors, Eccentric  Note: This exercise can place a lot of stress on your foot and ankle. Please complete this exercise only if specifically instructed by your caregiver.   Place the balls of your feet on a step. With your hands, use only enough support from a wall or rail to keep your balance.  Keep your knees straight and rise up on your toes.  Slowly shift your weight entirely to your right / left toes and pick up your opposite foot. Gently and with controlled movement, lower your weight through your right / left foot so that your heel drops below the level of the step. You will feel a slight stretch in the back of your calf at the end position.  Use the healthy leg to help rise up onto the balls of both feet, then lower weight only on the right / left leg again. Build up to 15 repetitions. Then progress to 3 consecutive sets of 15 repetitions.*  After completing the above exercise, complete the same exercise with a slight knee bend (about 30 degrees). Again, build up to 15 repetitions. Then progress to 3 consecutive sets of 15 repetitions.* Perform this exercise __________ times per day.  *When you easily complete 3 sets of 15, your physician, physical therapist or athletic trainer may advise you to add resistance by wearing a backpack filled with additional weight. STRENGTH - Plantar Flexors, Seated   Sit on a chair that allows your feet  to rest flat on the ground. If necessary, sit at the edge of the chair.  Keeping your toes firmly on the ground, lift your right / left heel as far as you can without increasing any discomfort in your ankle. Repeat __________ times. Complete this exercise __________ times a day. *If instructed by your physician, physical therapist or athletic trainer, you may add ____________________ of resistance by placing a weighted object on your right / left knee. Document Released: 02/23/2005 Document Revised: 10/17/2011 Document Reviewed: 11/06/2008 [employer] Patient Information 2014 [employer].    

## 2023-04-14 NOTE — Progress Notes (Signed)
Subjective:  Patient ID: Brenda Lewis, female    DOB: [birth year],  MRN: [id number]  Chief Complaint  Patient presents with   Follow-up    4 week follow up for stress fracture to right foot.     57 y.o. female presents for follow-up of right foot metatarsal stress fracture to the fourth metatarsal neck.  She has been immobilized in a cam boot for the past 4 weeks.  Pain is improved from prior having some soreness but much better than before.  She is having some pain at the back of her right heel.  Previously had a posterior spur resected.  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   Hyperlipidemia    Low back pain 12/30/2021   Neck pain 12/30/2021   Osteoarthritis of carpometacarpal [facility]) joint of thumb 03/02/2019   Other specified hearing loss, right ear    Pain of left hand 01/02/2019   Right wrist pain 01/02/2019   Stenosing tenosynovitis 01/02/2019   Vertigo     Allergies  Allergen Reactions   Gadolinium Derivatives Anaphylaxis, Shortness Of Breath and Hives   Bee Venom    Contrast Media [Iodinated Contrast Media]    Codeine Other (See Comments)    ROS: Negative except as per HPI above  Objective:  General: AAO x3, NAD  Dermatological: With inspection and palpation of the right and left lower extremities there are no open sores, no preulcerative lesions, no rash or signs of infection present. Nails are of normal length thickness and coloration.   Vascular:  Dorsalis Pedis artery and Posterior Tibial artery pedal pulses are 2/4 bilateral.  Capillary fill time < 3 sec to all digits.   Neruologic: Grossly intact via light touch bilateral. Protective threshold intact to all sites bilateral.   Musculoskeletal: No edema noted about the right lateral forefoot.  Decreased  and minimal pain with palpation along the course of the fourth metatarsal head and neck area.  Pain with palpation of the posterior aspect of the right heel near prior surgical site.  Gait: Unassisted, Nonantalgic.   No images are attached to the encounter.  Radiographs:  Date: 04/14/2023 XR right foot weightbearing AP/Lateral/Oblique   Findings: There attention directed to the fourth metatarsal neck area there is no obvious evidence of stress fracture at this time improved from prior.  At the posterior aspect of the calcaneus there is intra tendinous calcifications as well as residual osseous spurring noted.  Evidence of prior retrocalcaneal exostectomy.  Assessment:   1. Stress fracture of right foot with delayed healing, subsequent encounter   2. Achilles tendinitis of right lower extremity   3. Tendinosis      Plan:  Patient was evaluated and treated and all questions answered.  # Stress fracture of right foot fourth metatarsal neck -Improved from prior after immobilization in cam boot. -Patient is now able to continue wearing good supportive shoes monitor activity levels and do not increase too quickly -Continue calcium and vitamin D supplementation  # Right Achilles tendinitis and intra calcific tendinosis -Recommend steroid Dosepak as patient does not want steroid injection this time -eRx methylprednisone 4 mg steroid taper pack extracted for 6 days -Continue icing and stretching as well as compression therapy for the area  Corinna Gab, DPM [facility] / [facility]

## 2023-05-05 ENCOUNTER — Other Ambulatory Visit: Payer: Self-pay

## 2023-05-09 ENCOUNTER — Ambulatory Visit: Payer: BC Managed Care – PPO

## 2023-05-09 DIAGNOSIS — R0609 Other forms of dyspnea: Secondary | ICD-10-CM | POA: Diagnosis not present

## 2023-05-09 LAB — ECHOCARDIOGRAM COMPLETE
Area-P 1/2: 4.15 cm2
S' Lateral: 2.5 cm

## 2023-05-10 ENCOUNTER — Ambulatory Visit: Payer: BC Managed Care – PPO

## 2023-05-10 VITALS — BP 114/74 | HR 88 | Ht 65.0 in | Wt 166.8 lb

## 2023-05-10 DIAGNOSIS — R002 Palpitations: Secondary | ICD-10-CM | POA: Diagnosis not present

## 2023-05-10 DIAGNOSIS — R918 Other nonspecific abnormal finding of lung field: Secondary | ICD-10-CM

## 2023-05-10 DIAGNOSIS — E782 Mixed hyperlipidemia: Secondary | ICD-10-CM | POA: Diagnosis not present

## 2023-05-10 DIAGNOSIS — I2510 Atherosclerotic heart disease of native coronary artery without angina pectoris: Secondary | ICD-10-CM | POA: Diagnosis not present

## 2023-05-10 NOTE — Assessment & Plan Note (Signed)
Intermittent, infrequent. She is hesitant to pursue any further additional workup.  Her palpitations are short lasting and infrequent and not associated with any dangerous signs.  She feels these are bothersome but not limiting her activities throughout the day.  Her symptoms are more noticeable during resting times.  We discussed options of further monitoring with the event monitor for an extended time over 30 days. She would like to hold off on this at this time.  We discussed conservative approach with monitoring for symptoms if they are escalating in intensity and frequency, increase fluid intake and regular exercise to help with cardiac conditioning.  She is willing to pursue this.  We also discussed option to try low-dose beta-blocker such as metoprolol to tartrate, she wishes to hold off on this at this time as she is hesitant to start any medications. This would certainly be an option if her intensity of symptoms increases.  She does have an apple smart watch, unclear if she has capability of doing electrograms on the device.  She can consider a device with those capabilities to capture an event if it occurs in future.  Will be happy to review it for her.

## 2023-05-10 NOTE — Patient Instructions (Signed)
Medication Instructions:  Your physician recommends that you continue on your current medications as directed. Please refer to the Current Medication list given to you today.  *If you need a refill on your cardiac medications before your next appointment, please call your pharmacy*   Lab Work: None ordered If you have labs (blood work) drawn today and your tests are completely normal, you will receive your results only by: MyChart Message (if you have MyChart) OR A paper copy in the mail If you have any lab test that is abnormal or we need to change your treatment, we will call you to review the results.   Testing/Procedures: None ordered   Follow-Up: At [facility], you and your health needs are our priority.  As part of our continuing mission to provide you with exceptional heart care, we have created designated Provider Care Teams.  These Care Teams include your primary Cardiologist (physician) and Advanced Practice Providers (APPs -  Physician Assistants and Nurse Practitioners) who all work together to provide you with the care you need, when you need it.  We recommend signing up for the patient portal called "MyChart".  Sign up information is provided on this After Visit Summary.  MyChart is used to connect with patients for Virtual Visits (Telemedicine).  Patients are able to view lab/test results, encounter notes, upcoming appointments, etc.  Non-urgent messages can be sent to your provider as well.   To learn more about what you can do with MyChart, go to [email or web address].    Your next appointment:   12 month(s)  The format for your next appointment:   In Person  Provider:   Huntley Dec, MD    Other Instructions none  Important Information About Sugar

## 2023-05-10 NOTE — Progress Notes (Addendum)
Cardiology Office Note:    Date:  05/10/2023   ID:  Brenda Lewis, DOB [birth year], MRN [id number]  PCP:  Erskine Emery, NP  Cardiologist:  Marlyn Corporal Akim Watkinson, MD    Referring MD: Erskine Emery, NP   Shortness of breath  History of Present Illness:    Brenda Lewis is a 57 y.o. female here for follow-up visit.  She has history of anxiety, asthma, hyperlipidemia, vertigo, presenting with chest discomfort and shortness of breath that last office visit. Family history of hypercoagulability with DVT and PE  CT coronary angiogram done 04-13-2023 shows no significant coronary obstruction.  Calcium score 48 suggests mild atherosclerosis.  Mild narrowing observed in LAD. Extracardiac findings were significant for small right lung nodule and the radiologist recommended a 59-month follow-up.  Echocardiogram done 05-09-2023 noted normal biventricular function EF 60 to 65%, no significant valve abnormalities.  Here for the follow-up visit today to review the results and interval symptoms. She continues to report on and off episodes of chest discomfort associated with shortness of breath and a sense of palpitations like her heart is racing and last for few minutes.  She has been noticing these more often at rest.  Prior 5-day event monitor from 03/17/2023 through 03/23/2023 was reviewed with average heart rate 85/min [ranging from 61 bpm to 138 bpm] rare PVCs and PACs less than 1%.  Even though she had symptoms during the monitoring.  Apparently the device was off for battery changes.  Next  Denies any syncopal episodes.  Denies any falls.  About 1 or 2 episodes in the past couple weeks.  She is able to take statins Crestor 5 mg once daily, she describes a mild nonspecific sense of tiredness.  Last lipid panel from March 17, 2023 total cholesterol 285, triglycerides 177, LDL 180, HDL 70.    Past Medical History:  Diagnosis Date   Abnormal electrocardiogram    Acute stress  reaction    Anxiety disorder    Asthma    Bilateral carpal tunnel syndrome 01/02/2019   Chest pain, unspecified    Dupuytren's disease of palm 01/02/2019   Hyperlipidemia    Low back pain 12/30/2021   Neck pain 12/30/2021   Osteoarthritis of carpometacarpal ([facility]) joint of thumb 03/02/2019   Other specified hearing loss, right ear    Pain of left hand 01/02/2019   Right wrist pain 01/02/2019   Stenosing tenosynovitis 01/02/2019   Vertigo     Past Surgical History:  Procedure Laterality Date   ADENOIDECTOMY     APPENDECTOMY  08/1987   CHOLECYSTECTOMY     EYE SURGERY Bilateral    Laser eye surgery   FOOT SURGERY     KNEE SURGERY Right 1994   TONSILLECTOMY      Current Medications: Current Meds  Medication Sig   albuterol (VENTOLIN HFA) 108 (90 Base) MCG/ACT inhaler Inhale 2 puffs into the lungs every 6 (six) hours as needed for wheezing or shortness of breath.   ALPRAZolam (XANAX) 0.25 MG tablet Take 0.25 mg by mouth 2 (two) times daily as needed for anxiety.   aspirin EC 81 MG tablet Take 1 tablet (81 mg total) by mouth daily. Swallow whole.   meclizine (ANTIVERT) 25 MG tablet Take 25 mg by mouth 3 (three) times daily as needed for dizziness.   methylPREDNISolone (MEDROL DOSEPAK) 4 MG TBPK tablet Take as directed for 6 days   rosuvastatin (CRESTOR) 5 MG tablet Take 1 tablet (5 mg total) by mouth  daily.   SKYRIZI PEN 150 MG/ML SOAJ Inject 150 mg into the skin every 3 (three) months.     Allergies:   Gadolinium derivatives, Bee venom, Contrast media [iodinated contrast media], and Codeine   Social History   Socioeconomic History   Marital status: Married    Spouse name: Not on file   Number of children: Not on file   Years of education: Not on file   Highest education level: Not on file  Occupational History   Not on file  Tobacco Use   Smoking status: Never   Smokeless tobacco: Never  Vaping Use   Vaping status: Never Used  Substance and Sexual Activity    Alcohol use: Not Currently    Comment: occasionally   Drug use: Never   Sexual activity: Not on file  Other Topics Concern   Not on file  Social History Narrative   Not on file   Social Determinants of Health   Financial Resource Strain: Not on file  Food Insecurity: Not on file  Transportation Needs: Not on file  Physical Activity: Not on file  Stress: Not on file  Social Connections: Not on file     Family History: The patient's family history includes Rheum arthritis in her father. ROS:   Please see the history of present illness.    All 14 point review of systems negative except as described per history of present illness  EKGs/Labs/Other Studies Reviewed:         Recent Labs: No results found for requested labs within last 365 days.  Recent Lipid Panel No results found for: "CHOL", "TRIG", "HDL", "CHOLHDL", "VLDL", "LDLCALC", "LDLDIRECT"  Physical Exam:    VS:  BP 114/74   Pulse 88   Ht 5\' 5"  (1.651 m)   Wt 166 lb 12.8 oz (75.7 kg)   SpO2 95%   BMI 27.76 kg/m     Wt Readings from Last 3 Encounters:  05/10/23 166 lb 12.8 oz (75.7 kg)  04/04/23 163 lb 3.2 oz (74 kg)  03/17/23 160 lb (72.6 kg)     GENERAL:  Well nourished, well developed in no acute distress NEUROLOGIC:  Alert and oriented x 3 We deferred rest of the physical exam and spent most of the time discussing her symptoms and the workup done so far and the plan.  ASSESSMENT AND PLAN:   [name] 57 year old woman seen today for follow-up. 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  Prior workup with event monitor unremarkable Echocardiogram with normal biventricular function and no significant valve abnormalities CT coronary angiogram as reviewed above  with mild nonobstructive coronary artery disease    Problem List Items Addressed This Visit     Hyperlipidemia    In the setting of mild nonobstructive coronary artery disease as noted on CT coronary angiogram we discussed cardiovascular risk reduction strategies.  In this context we discussed importance of statin therapy and the benefits of preventing progression of coronary atherosclerosis and maturing the plaque.  Potential risks of muscle aches, myopathies, increased incidence of diabetes were reviewed. She is currently able to tolerate the low-dose of Crestor 5 mg once daily. She is hesitant to further titrate up the dose any further.  In this context continue with the current dose of Crestor 5 mg once daily. Since  she is hesitant to titrate up the dose, hold off on rechecking lipid panel until next year.        Palpitations - Primary    Intermittent, infrequent. She is hesitant to pursue any further additional workup.  Her palpitations are short lasting and infrequent and not associated with any dangerous signs.  She feels these are bothersome but not limiting her activities throughout the day.  Her symptoms are more noticeable during resting times.  We discussed options of further monitoring with the event monitor for an extended time over 30 days. She would like to hold off on this at this time.  We discussed conservative approach with monitoring for symptoms if they are escalating in intensity and frequency, increase fluid intake and regular exercise to help with cardiac conditioning.  She is willing to pursue this.  We also discussed option to try low-dose beta-blocker such as metoprolol to tartrate, she wishes to hold off on this at this time as she is hesitant to start any medications. This would certainly be an option if her intensity of symptoms increases.  She does have an apple smart watch, unclear if she has capability of doing electrograms on the device.  She can  consider a device with those capabilities to capture an event if it occurs in future.  Will be happy to review it for her.        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 Healthy diet and lifestyle recommended. Continue with aspirin 81 mg once daily.  This can be interrupted as needed for any procedures or symptoms.  Continue with statin therapy as discussed.      Pulmonary nodules/lesions, multiple, largest 3 mm on CT coronary 04-13-2023    Has personal history of secondhand smoking exposure from her parents  Radiologist recommended 43-month follow-up if there are risk factors. She is aware and PCP notified. Would recommend follow-up imaging with CT chest in 12 months and will appreciate coordination by PCP in this regard.      Return to clinic in 1 year or as needed.  Medication Adjustments/Labs and Tests Ordered: Current medicines are reviewed at length with the patient today.  Concerns regarding medicines are outlined above.  No orders of the defined types were placed in this encounter.  Medication changes: No orders of the defined types were placed in this encounter.   Signed, Cecille Amsterdam, MD, MPH, [facility] 05/10/2023 8:42 AM    [facility]

## 2023-05-10 NOTE — Assessment & Plan Note (Signed)
In the setting of mild nonobstructive coronary artery disease as noted on CT coronary angiogram we discussed cardiovascular risk reduction strategies.  In this context we discussed importance of statin therapy and the benefits of preventing progression of coronary atherosclerosis and maturing the plaque.  Potential risks of muscle aches, myopathies, increased incidence of diabetes were reviewed. She is currently able to tolerate the low-dose of Crestor 5 mg once daily. She is hesitant to further titrate up the dose any further.  In this context continue with the current dose of Crestor 5 mg once daily. Since she is hesitant to titrate up the dose, hold off on rechecking lipid panel until next year.

## 2023-05-10 NOTE — Assessment & Plan Note (Signed)
Has personal history of secondhand smoking exposure from her parents  Radiologist recommended 65-month follow-up if there are risk factors. She is aware and PCP notified. Would recommend follow-up imaging with CT chest in 12 months and will appreciate coordination by PCP in this regard.

## 2023-05-10 NOTE — Assessment & Plan Note (Signed)
Continue risk reductions allergies. Healthy diet and lifestyle recommended. Continue with aspirin 81 mg once daily.  This can be interrupted as needed for any procedures or symptoms.  Continue with statin therapy as discussed.

## 2023-05-17 ENCOUNTER — Other Ambulatory Visit: Payer: Self-pay | Admitting: Family

## 2023-05-17 DIAGNOSIS — Z1231 Encounter for screening mammogram for malignant neoplasm of breast: Secondary | ICD-10-CM

## 2023-05-22 ENCOUNTER — Ambulatory Visit
Admission: RE | Admit: 2023-05-22 | Discharge: 2023-05-22 | Disposition: A | Discharge status: Home / Self Care | Payer: BC Managed Care – PPO | Source: Ambulatory Visit | Attending: Family | Admitting: Family

## 2023-05-22 DIAGNOSIS — Z1231 Encounter for screening mammogram for malignant neoplasm of breast: Secondary | ICD-10-CM

## 2024-05-13 ENCOUNTER — Telehealth: Payer: Self-pay

## 2024-05-13 NOTE — Telephone Encounter (Signed)
 Called pt to schedule 12 month f/u per provider recommendations, pt declined scheduling any f/u appts at this time stating she wasn't having any issues.

## 2024-06-05 ENCOUNTER — Other Ambulatory Visit: Payer: Self-pay | Admitting: Medical Genetics

## 2024-06-05 DIAGNOSIS — Z006 Encounter for examination for normal comparison and control in clinical research program: Secondary | ICD-10-CM

## 2024-06-20 ENCOUNTER — Other Ambulatory Visit: Payer: Self-pay | Admitting: Family

## 2024-06-20 DIAGNOSIS — Z1231 Encounter for screening mammogram for malignant neoplasm of breast: Secondary | ICD-10-CM

## 2024-06-28 ENCOUNTER — Inpatient Hospital Stay: Admission: RE | Admit: 2024-06-28 | Payer: BC Managed Care – PPO | Source: Ambulatory Visit

## 2024-07-17 LAB — GENECONNECT MOLECULAR SCREEN: Genetic Analysis Overall Interpretation: NEGATIVE
